# Patient Record
Sex: Female | Born: 1935 | Race: White | Hispanic: No | Smoking: Never smoker
Health system: Southern US, Community
[De-identification: ages and names within clinical notes are randomized; demographics above are authoritative.]

## PROBLEM LIST (undated history)

## (undated) DIAGNOSIS — Y92009 Unspecified place in unspecified non-institutional (private) residence as the place of occurrence of the external cause: Secondary | ICD-10-CM

## (undated) DIAGNOSIS — W19XXXA Unspecified fall, initial encounter: Secondary | ICD-10-CM

## (undated) DIAGNOSIS — E7800 Pure hypercholesterolemia, unspecified: Secondary | ICD-10-CM

## (undated) DIAGNOSIS — F32A Depression, unspecified: Secondary | ICD-10-CM

## (undated) DIAGNOSIS — I509 Heart failure, unspecified: Secondary | ICD-10-CM

## (undated) DIAGNOSIS — M1990 Unspecified osteoarthritis, unspecified site: Secondary | ICD-10-CM

## (undated) DIAGNOSIS — C4440 Unspecified malignant neoplasm of skin of scalp and neck: Secondary | ICD-10-CM

## (undated) DIAGNOSIS — C189 Malignant neoplasm of colon, unspecified: Secondary | ICD-10-CM

## (undated) DIAGNOSIS — I499 Cardiac arrhythmia, unspecified: Secondary | ICD-10-CM

## (undated) DIAGNOSIS — F329 Major depressive disorder, single episode, unspecified: Secondary | ICD-10-CM

## (undated) DIAGNOSIS — F419 Anxiety disorder, unspecified: Secondary | ICD-10-CM

## (undated) DIAGNOSIS — G039 Meningitis, unspecified: Secondary | ICD-10-CM

## (undated) DIAGNOSIS — R251 Tremor, unspecified: Secondary | ICD-10-CM

## (undated) DIAGNOSIS — F0390 Unspecified dementia without behavioral disturbance: Secondary | ICD-10-CM

## (undated) HISTORY — PX: CATARACT EXTRACTION W/ INTRAOCULAR LENS  IMPLANT, BILATERAL: SHX1307

## (undated) HISTORY — PX: HIP FRACTURE SURGERY: SHX118

## (undated) HISTORY — PX: FRACTURE SURGERY: SHX138

---
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
## 1981-05-06 DIAGNOSIS — C4440 Unspecified malignant neoplasm of skin of scalp and neck: Secondary | ICD-10-CM

## 1981-05-06 HISTORY — PX: SKIN CANCER EXCISION: SHX779

## 1981-05-06 HISTORY — DX: Unspecified malignant neoplasm of skin of scalp and neck: C44.40

## 2014-05-06 HISTORY — PX: COLON SURGERY: SHX602

## 2015-12-05 ENCOUNTER — Emergency Department (HOSPITAL_COMMUNITY): Payer: Medicare Other

## 2015-12-05 ENCOUNTER — Emergency Department (HOSPITAL_COMMUNITY)
Admission: EM | Admit: 2015-12-05 | Discharge: 2015-12-05 | Disposition: A | Discharge status: Home / Self Care | Payer: Medicare Other | Attending: Physician Assistant | Admitting: Physician Assistant

## 2015-12-05 ENCOUNTER — Encounter (HOSPITAL_COMMUNITY): Payer: Self-pay | Admitting: Emergency Medicine

## 2015-12-05 DIAGNOSIS — Y9389 Activity, other specified: Secondary | ICD-10-CM | POA: Insufficient documentation

## 2015-12-05 DIAGNOSIS — S82154A Nondisplaced fracture of right tibial tuberosity, initial encounter for closed fracture: Secondary | ICD-10-CM

## 2015-12-05 DIAGNOSIS — Z7982 Long term (current) use of aspirin: Secondary | ICD-10-CM | POA: Diagnosis not present

## 2015-12-05 DIAGNOSIS — Z96641 Presence of right artificial hip joint: Secondary | ICD-10-CM | POA: Diagnosis not present

## 2015-12-05 DIAGNOSIS — S82251A Displaced comminuted fracture of shaft of right tibia, initial encounter for closed fracture: Secondary | ICD-10-CM | POA: Insufficient documentation

## 2015-12-05 DIAGNOSIS — Z85818 Personal history of malignant neoplasm of other sites of lip, oral cavity, and pharynx: Secondary | ICD-10-CM | POA: Diagnosis not present

## 2015-12-05 DIAGNOSIS — S8991XA Unspecified injury of right lower leg, initial encounter: Secondary | ICD-10-CM | POA: Diagnosis present

## 2015-12-05 DIAGNOSIS — W010XXA Fall on same level from slipping, tripping and stumbling without subsequent striking against object, initial encounter: Secondary | ICD-10-CM | POA: Insufficient documentation

## 2015-12-05 DIAGNOSIS — Y92009 Unspecified place in unspecified non-institutional (private) residence as the place of occurrence of the external cause: Secondary | ICD-10-CM | POA: Diagnosis not present

## 2015-12-05 DIAGNOSIS — Z85038 Personal history of other malignant neoplasm of large intestine: Secondary | ICD-10-CM | POA: Diagnosis not present

## 2015-12-05 DIAGNOSIS — S82143A Displaced bicondylar fracture of unspecified tibia, initial encounter for closed fracture: Secondary | ICD-10-CM

## 2015-12-05 DIAGNOSIS — Y999 Unspecified external cause status: Secondary | ICD-10-CM | POA: Insufficient documentation

## 2015-12-05 DIAGNOSIS — I509 Heart failure, unspecified: Secondary | ICD-10-CM | POA: Insufficient documentation

## 2015-12-05 DIAGNOSIS — S82141A Displaced bicondylar fracture of right tibia, initial encounter for closed fracture: Secondary | ICD-10-CM

## 2015-12-05 HISTORY — DX: Depression, unspecified: F32.A

## 2015-12-05 HISTORY — DX: Heart failure, unspecified: I50.9

## 2015-12-05 HISTORY — DX: Major depressive disorder, single episode, unspecified: F32.9

## 2015-12-05 HISTORY — DX: Tremor, unspecified: R25.1

## 2015-12-05 HISTORY — DX: Unspecified osteoarthritis, unspecified site: M19.90

## 2015-12-05 HISTORY — DX: Anxiety disorder, unspecified: F41.9

## 2015-12-05 LAB — CBC WITH DIFFERENTIAL/PLATELET
Basophils Absolute: 0 10*3/uL (ref 0.0–0.1)
Basophils Relative: 0 %
EOS ABS: 0 10*3/uL (ref 0.0–0.7)
EOS PCT: 1 %
HCT: 40.8 % (ref 36.0–46.0)
HEMOGLOBIN: 13 g/dL (ref 12.0–15.0)
LYMPHS ABS: 0.9 10*3/uL (ref 0.7–4.0)
LYMPHS PCT: 11 %
MCH: 32.1 pg (ref 26.0–34.0)
MCHC: 31.9 g/dL (ref 30.0–36.0)
MCV: 100.7 fL — AB (ref 78.0–100.0)
MONOS PCT: 8 %
Monocytes Absolute: 0.6 10*3/uL (ref 0.1–1.0)
NEUTROS PCT: 80 %
Neutro Abs: 6.9 10*3/uL (ref 1.7–7.7)
Platelets: 177 10*3/uL (ref 150–400)
RBC: 4.05 MIL/uL (ref 3.87–5.11)
RDW: 12.8 % (ref 11.5–15.5)
WBC: 8.5 10*3/uL (ref 4.0–10.5)

## 2015-12-05 LAB — PROTIME-INR
INR: 1.11
PROTHROMBIN TIME: 14.3 s (ref 11.4–15.2)

## 2015-12-05 LAB — COMPREHENSIVE METABOLIC PANEL
ALT: 44 U/L (ref 14–54)
AST: 44 U/L — AB (ref 15–41)
Albumin: 3.3 g/dL — ABNORMAL LOW (ref 3.5–5.0)
Alkaline Phosphatase: 85 U/L (ref 38–126)
Anion gap: 6 (ref 5–15)
BUN: 12 mg/dL (ref 6–20)
CHLORIDE: 99 mmol/L — AB (ref 101–111)
CO2: 31 mmol/L (ref 22–32)
CREATININE: 0.85 mg/dL (ref 0.44–1.00)
Calcium: 9 mg/dL (ref 8.9–10.3)
GFR calc Af Amer: >60 mL/min (ref >60)
GFR calc non Af Amer: 58 mL/min — ABNORMAL LOW (ref >60)
Glucose, Bld: 104 mg/dL — ABNORMAL HIGH (ref 65–99)
Potassium: 5.1 mmol/L (ref 3.5–5.1)
SODIUM: 136 mmol/L (ref 135–145)
Total Bilirubin: 0.2 mg/dL — ABNORMAL LOW (ref 0.3–1.2)
Total Protein: 6.5 g/dL (ref 6.5–8.1)

## 2015-12-05 LAB — TYPE AND SCREEN
ABO/RH(D): O POS
Antibody Screen: NEGATIVE

## 2015-12-05 LAB — ABO/RH: ABO/RH(D): O POS

## 2015-12-05 MED ORDER — ONDANSETRON HCL 4 MG PO TABS: 4.0000 mg | ORAL_TABLET | Freq: Three times a day (TID) | ORAL | 0 refills | Status: DC | PRN

## 2015-12-05 MED ORDER — FENTANYL CITRATE (PF) 100 MCG/2ML IJ SOLN
25.0000 ug | Freq: Once | INTRAMUSCULAR | Status: AC
  Administered 2015-12-05: 25 ug via INTRAVENOUS
  Filled 2015-12-05: qty 2

## 2015-12-05 MED ORDER — OXYCODONE-ACETAMINOPHEN 5-325 MG PO TABS: 1.0000 | ORAL_TABLET | Freq: Four times a day (QID) | ORAL | 0 refills | Status: DC | PRN

## 2015-12-05 MED ORDER — ONDANSETRON HCL 4 MG/2ML IJ SOLN
4.0000 mg | Freq: Once | INTRAMUSCULAR | Status: AC
  Administered 2015-12-05: 4 mg via INTRAVENOUS
  Filled 2015-12-05: qty 2

## 2015-12-05 NOTE — ED Notes (Signed)
Mariann Laster from Case Management at bedside.

## 2015-12-05 NOTE — ED Notes (Signed)
Unable to obtain pulses on the right with doppler. Attempted with color option on the [place] with no success

## 2015-12-05 NOTE — ED Notes (Signed)
Xray at the bedside.

## 2015-12-05 NOTE — ED Notes (Signed)
Pt's daughter out in hallway to speak with the pt. Pt's daughter very unhappy about the amount of time it's taking for case manager and stated "take my mother off the monitor and I want her out of here now, this is completely unacceptable that it is taking so long." This RN in room with pt to explain process. Wanda CM now in room with pt now.

## 2015-12-05 NOTE — ED Triage Notes (Signed)
Pt came to [place] via [facility]. Pt stated that she fell this morning at 0600 at home because she tripped over a rug. She lives with family. Pt called out and daughter assisted pt in getting up. Pt complains of Right leg, knee, and hip pain. 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 Pt has a hx of CHF.  Vitals via EMS 140/84 76 p 16 rr 98%

## 2015-12-05 NOTE — Discharge Instructions (Signed)
Please keep your leg elevated above your head, with ice on it. Use knee immbolilizer until you follow up with Dr. Sharol Given.   You can use teylnol for pain and use the stronger pain medication we provided for breakthrough pain (you can use the nausea med with it) (But becareful because it can make people dizzy)

## 2015-12-05 NOTE — ED Provider Notes (Signed)
[facility] DEPT Provider Note   CSN: [id number] Arrival date & time: 12/05/15  [id number]  First Provider Contact:  First MD Initiated Contact with Patient 12/05/15 1022        History   Chief Complaint Chief Complaint  Patient presents with  . Fall    HPI Kayla Pace is a 82 y.o. female.  HPI   Pt is a 82 yo female presenting after mechanical fall at home. Recently moved here, no orthopeadist in the area. Patient has had total hip on the R with replacement of ball as well. Tripped over carpet today, family presetn. No LOC, did not strike head.   Pain to RLE.  Past Medical History:  Diagnosis Date  . Anxiety and depression   . Arthritis   . Cancer ([place])    Neck, colon  . CHF (congestive heart failure) ([place])   . Occasional tremors     There are no active problems to display for this patient.   Past Surgical History:  Procedure Laterality Date  . HIP FRACTURE SURGERY     Right side    OB History    No data available       Home Medications    Prior to Admission medications   Medication Sig Start Date End Date Taking? Authorizing Provider  Ascorbic Acid (VITAMIN C) 1000 MG tablet Take 1 tablet by mouth daily.   Yes Historical Provider, MD  aspirin 81 MG tablet Take 1 tablet by mouth daily. 02/09/08  Yes Historical Provider, MD  clonazePAM (KLONOPIN) 0.5 MG tablet Take 1 tablet by mouth at bedtime.   Yes Historical Provider, MD  digoxin (LANOXIN) 0.125 MG tablet Take 1 tablet by mouth daily. 03/25/12  Yes Historical Provider, MD  FLUoxetine (PROZAC) 20 MG capsule Take 1 capsule by mouth daily. 02/08/13  Yes Historical Provider, MD  fluticasone (FLONASE) 50 MCG/ACT nasal spray Place 2 sprays into the nose daily. 03/25/12  Yes Historical Provider, MD  furosemide (LASIX) 20 MG tablet Take 1 tablet by mouth daily. 02/08/13  Yes Historical Provider, MD  meloxicam (MOBIC) 7.5 MG tablet Take 1 tablet by mouth daily as needed for pain. 11/29/15 12/29/15 Yes Historical  Provider, MD  metoprolol succinate (TOPROL-XL) 25 MG 24 hr tablet Take 1 tablet by mouth daily. 02/01/13  Yes Historical Provider, MD  omeprazole (PRILOSEC) 20 MG capsule Take 1 capsule by mouth daily. 03/25/12  Yes Historical Provider, MD  primidone (MYSOLINE) 250 MG tablet Take 1 tablet by mouth 2 (two) times daily. 02/08/13  Yes Historical Provider, MD  simvastatin (ZOCOR) 10 MG tablet Take 1 tablet by mouth at bedtime. 03/25/12  Yes Historical Provider, MD    Family History No family history on file.  Social History Social History  Substance Use Topics  . Smoking status: Never Smoker  . Smokeless tobacco: Never Used  . Alcohol use No     Allergies   Codeine   Review of Systems Review of Systems  Constitutional: Negative for activity change.  Respiratory: Negative for shortness of breath.   Cardiovascular: Negative for chest pain.  Gastrointestinal: Negative for abdominal pain.  Musculoskeletal: Positive for gait problem and joint swelling.  Psychiatric/Behavioral: Negative for behavioral problems.  All other systems reviewed and are negative.    Physical Exam Updated Vital Signs BP 155/82 (BP Location: Right Arm)   Pulse 64   Temp 98.4 F (36.9 C) (Oral)   Resp 12   Ht 5\' 1"  (1.549 m)   Wt 98 lb (44.5  kg)   SpO2 100%   BMI 18.52 kg/m   Physical Exam  Constitutional: She appears well-developed and well-nourished. No distress.  HENT:  Head: Normocephalic and atraumatic.  Eyes: Conjunctivae are normal.  Neck: Neck supple.  Cardiovascular: Normal rate and regular rhythm.   No murmur heard. Pulmonary/Chest: Effort normal and breath sounds normal. No respiratory distress.  Abdominal: Soft. There is no tenderness.  Musculoskeletal: She exhibits no edema.  Deformed RLE,  Tight compartment. + cap refill distablly, moving distally, warm but no palpable pulse. + femoral pulse.  Neurological: She is alert.  Skin: Skin is warm and dry.  Psychiatric: She has a normal  mood and affect.  Nursing note and vitals reviewed.    ED Treatments / Results  Labs (all labs ordered are listed, but only abnormal results are displayed) Labs Reviewed  COMPREHENSIVE METABOLIC PANEL  CBC WITH DIFFERENTIAL/PLATELET  PROTIME-INR  TYPE AND SCREEN    EKG  EKG Interpretation None       Radiology No results found.  Procedures Procedures (including critical care time)  Medications Ordered in ED Medications - No data to display   Initial Impression / Assessment and Plan / ED Course  I have reviewed the triage vital signs and the nursing notes.  Pertinent labs & imaging results that were available during my care of the patient were reviewed by me and considered in my medical decision making (see chart for details).  Clinical Course  Comment By Time  10:22 AM Just saw patietn, no palpable pulse in lower extremity, nursing to find doppler. Xrays changed to portable. Pt comfortable.  Ailsa Mireles Lyn Thomasene Lot, MD 08/01 1022  10:51 AM Used [place] to find -arterial flow. Vashon Riordan Julio Alm, MD 08/01 1051  12:34 PM Discussed with Sharol Given, he will review films. Samaiya Awadallah Julio Alm, MD 08/01 1234  Dr. Sharol Given saw patient. Requesting medicine admit. Will discuss with medicine.  Alwin Lanigan Lyn Tola Meas, MD 08/01 1355  There is not much that warrants a hospitlal admission: no need for IV pain medicine, or abx. Knee just needs to be in an immbolizier, Medicine thinks outpatient managemetn appropriate. We will talk with case management to increase home services.  Deunte Bledsoe Lyn Rosine Solecki, MD 08/01 1429    Attempting to find doppler to confrim pulses. Patient not in great ammounts of pain so compartment syndrome less likely, though concerned given tightness and location of likely fracture. Portable xrays ordered.    2:43 PM Sharol Given saw patient, knee immobilizer and ice are the reccs. Awaiting case managemetn.   Case management will help maximize home health care. Pt's daughter has  special bed and chair at home to help with mobilization. She expressed understanding about icing and return precautions.   Final Clinical Impressions(s) / ED Diagnoses   Final diagnoses:  None    New Prescriptions New Prescriptions   No medications on file     Arch Methot Julio Alm, MD 12/06/15 [id number]

## 2015-12-05 NOTE — Consult Note (Signed)
ORTHOPAEDIC CONSULTATION  REQUESTING PHYSICIAN: Courteney Lyn Mackuen, MD  Chief Complaint: Right knee pain and swelling  HPI: Kayla Pace is a 82 y.o. female who presents with a minimally displaced tibial tuberosity fracture right knee. Patient had a ground-level fall at her daughter's home no head trauma and no loss of consciousness. Patient's daughter is just recently recovering from a stroke and a seizure. Patient 2 months ago had a hip replacement  secondary to a femoral neck fracture and was brought down from [place] to be cared for at her daughter's home.  Past Medical History:  Diagnosis Date  . Anxiety and depression   . Arthritis   . Cancer ([place])    Neck, colon  . CHF (congestive heart failure) ([place])   . Occasional tremors    Past Surgical History:  Procedure Laterality Date  . HIP FRACTURE SURGERY     Right side   Social History   Social History  . Marital status: Unknown    Spouse name: N/A  . Number of children: N/A  . Years of education: N/A   Social History Main Topics  . Smoking status: Never Smoker  . Smokeless tobacco: Never Used  . Alcohol use No  . Drug use: No  . Sexual activity: Not Asked   Other Topics Concern  . None   Social History Narrative  . None   No family history on file. - negative except otherwise stated in the family history section Allergies  Allergen Reactions  . Codeine Nausea And Vomiting  . Morphine Other (See Comments)   Prior to Admission medications   Medication Sig Start Date End Date Taking? Authorizing Provider  Ascorbic Acid (VITAMIN C) 1000 MG tablet Take 1 tablet by mouth daily.   Yes Historical Provider, MD  aspirin 81 MG tablet Take 1 tablet by mouth daily. 02/09/08  Yes Historical Provider, MD  clonazePAM (KLONOPIN) 0.5 MG tablet Take 1 tablet by mouth at bedtime.   Yes Historical Provider, MD  digoxin (LANOXIN) 0.125 MG tablet Take 1 tablet by mouth daily. 03/25/12  Yes Historical Provider, MD    FLUoxetine (PROZAC) 20 MG capsule Take 1 capsule by mouth daily. 02/08/13  Yes Historical Provider, MD  fluticasone (FLONASE) 50 MCG/ACT nasal spray Place 2 sprays into the nose daily. 03/25/12  Yes Historical Provider, MD  furosemide (LASIX) 20 MG tablet Take 1 tablet by mouth daily. 02/08/13  Yes Historical Provider, MD  meloxicam (MOBIC) 7.5 MG tablet Take 1 tablet by mouth daily as needed for pain. 11/29/15 12/29/15 Yes Historical Provider, MD  metoprolol succinate (TOPROL-XL) 25 MG 24 hr tablet Take 1 tablet by mouth daily. 02/01/13  Yes Historical Provider, MD  omeprazole (PRILOSEC) 20 MG capsule Take 1 capsule by mouth daily. 03/25/12  Yes Historical Provider, MD  primidone (MYSOLINE) 250 MG tablet Take 1 tablet by mouth 2 (two) times daily. 02/08/13  Yes Historical Provider, MD  simvastatin (ZOCOR) 10 MG tablet Take 1 tablet by mouth at bedtime. 03/25/12  Yes Historical Provider, MD   Dg Tibia/fibula Right  Result Date: 12/05/2015 CLINICAL DATA:  Acute right lower leg pain after fall today. EXAM: RIGHT TIBIA AND FIBULA - 2 VIEW COMPARISON:  None. FINDINGS: Moderately displaced and probably comminuted fracture is seen involving the tibial tuberosity in the proximal tibia. This appears to be closed and posttraumatic. Severe degenerative changes seen involving the right knee. No soft tissue abnormality is noted. IMPRESSION: Moderately displaced and probably comminuted fracture involving tibial tuberosity  of the proximal right tibia. CT scan may be performed for further evaluation. Electronically Signed   By: Marijo Conception, M.D.   On: 12/05/2015 11:41   Dg Ankle 2 Views Right  Result Date: 12/05/2015 CLINICAL DATA:  Fall this morning. Slipped on a or rub. Right ankle pain. Initial encounter. EXAM: RIGHT ANKLE - 2 VIEW COMPARISON:  None. FINDINGS: The bones are osteopenic. There is mild soft tissue swelling about the ankle, greatest laterally. No acute fracture or dislocation is identified. A small  posterior calcaneal enthesophyte is noted. Vascular calcifications are noted. IMPRESSION: Soft tissue swelling without acute osseous abnormality identified. Electronically Signed   By: Logan Bores M.D.   On: 12/05/2015 11:44   Dg Knee Right Port  Result Date: 12/05/2015 CLINICAL DATA:  Right knee pain after fall today. EXAM: PORTABLE RIGHT KNEE - 1-2 VIEW COMPARISON:  None. FINDINGS: Severe narrowing of lateral joint space is noted. Moderately displaced and probably comminuted fracture is seen involving the tibial tuberosity. Mild suprapatellar joint effusion is noted. IMPRESSION: Moderately displaced and probably comminuted fracture of the tibial tuberosity. CT scan may be performed for further evaluation. Severe degenerative joint disease is noted laterally. Electronically Signed   By: Marijo Conception, M.D.   On: 12/05/2015 13:07   Dg Hip Unilat W Or W/o Pelvis 2-3 Views Right  Result Date: 12/05/2015 CLINICAL DATA:  [place] this morning.  Right-sided hip pain. EXAM: DG HIP (WITH OR WITHOUT PELVIS) 2-3V RIGHT COMPARISON:  None. FINDINGS: Previous bipolar hip replacement on the right. No evidence of fracture or dislocation. IMPRESSION: No acute finding.  Previous bipolar hip replacement on the right. Electronically Signed   By: Nelson Chimes M.D.   On: 12/05/2015 11:41   - pertinent xrays, CT, MRI studies were reviewed and independently interpreted  Positive ROS: All other systems have been reviewed and were otherwise negative with the exception of those mentioned in the HPI and as above.  Physical Exam: General: Alert, no acute distress Cardiovascular: No pedal edema Respiratory: No cyanosis, no use of accessory musculature GI: No organomegaly, abdomen is soft and non-tender Skin: Swelling and ecchymosis and bruising over the proximal tibia. Patient's compartments are soft she has no pain with active or passive range of motion of the ankle or toes. There is no open wound noted drainage. Neurologic:  Sensation intact distally Psychiatric: Patient is competent for consent with normal mood and affect Lymphatic: No axillary or cervical lymphadenopathy  MUSCULOSKELETAL:  On examination patient has a palpable dorsalis pedis pulse. Her foot is warm with good capillary refill. The swelling is directly over the tibial tuberosity. There are no skin lesions no breakdown of the skin. Review of the radiographs shows a minimally displaced tibial tuberosity fracture. The joint was congruent no signs of a tibial plateau fracture. The tibial shaft shows no fractures and the ankle shows no fractures.  Assessment: Assessment: Proximal tibial tuberosity fracture with swelling and ecchymosis and bruising with no signs or symptoms of compartment syndrome.  Plan: Plan: Patient most likely would benefit from admission with observation and nursing placement. We'll apply a knee immobilizer she may be weightbearing as tolerated with knee immobilizer she will need strict elevation of her leg and ice to minimize risks of compartment syndrome.  Thank you for the consult and the opportunity to see Ms. Maryelizabeth Rowan, [place] [phone] 1:48 PM

## 2015-12-05 NOTE — ED Notes (Signed)
Pt called out and states she was nauseous. MD notified and to place order for zofran.

## 2015-12-05 NOTE — Care Management Note (Signed)
Case Management Note  Patient Details  Name: Kayla Pace MRN: [id number] Date of Birth: 1922/03/27  Subjective/Objective:     S/p Fractured patellar                Action/Plan: CM met with patient and daughter Hassan Rowan [phone] to discuss recommendations for [facility] services Verified information with patient and family. Pt lives at home at home with daughter Hassan Rowan as  primary care giver, [facility] services are agreed upon by patient and family as disposition plan. Discussed HH services RN/PT/OT/HHA . Offered choice, provided [facility] agency list, Patient and daughter selected [facility] services. Daughter denies the need of DME but requesting shower chair, prescription provided.  Explained the services to be received by [facility] services, patient and daughter verbalized understanding teach back done. Patient and  Verified contact information. Referral faxed in to [facility] [phone], fax confirmation received. No further questions or concerns verbalized. Provided patient and daughter contact information should any further question or concerns should arise. No further ED CM needs identified.  Expected Discharge Date:    12/05/2015              Expected Discharge Plan:  [place]  In-House Referral:     Discharge planning Services  CM Consult  Post Acute Care Choice:    Choice offered to:  Patient, Adult Children  DME Arranged:    DME Agency:     HH Arranged:  RN, PT, OT, Nurse's Aide [place] Agency:  [facility]  Status of Service:  Completed, signed off  If discussed at [place] of Stay Meetings, dates discussed:    Additional CommentsLaurena Slimmer, RN 12/05/2015, 6:30 PM

## 2015-12-07 ENCOUNTER — Encounter (HOSPITAL_COMMUNITY): Payer: Self-pay | Admitting: General Practice

## 2015-12-07 ENCOUNTER — Observation Stay (HOSPITAL_COMMUNITY)
Admission: AD | Admit: 2015-12-07 | Discharge: 2015-12-08 | Disposition: A | Discharge status: Home / Self Care | Payer: Medicare Other | Source: Ambulatory Visit | Attending: Orthopedic Surgery | Admitting: Orthopedic Surgery

## 2015-12-07 ENCOUNTER — Other Ambulatory Visit (HOSPITAL_COMMUNITY): Payer: Self-pay | Admitting: Family

## 2015-12-07 DIAGNOSIS — Z85038 Personal history of other malignant neoplasm of large intestine: Secondary | ICD-10-CM | POA: Insufficient documentation

## 2015-12-07 DIAGNOSIS — S82101A Unspecified fracture of upper end of right tibia, initial encounter for closed fracture: Principal | ICD-10-CM | POA: Insufficient documentation

## 2015-12-07 DIAGNOSIS — W1830XA Fall on same level, unspecified, initial encounter: Secondary | ICD-10-CM | POA: Insufficient documentation

## 2015-12-07 DIAGNOSIS — R262 Difficulty in walking, not elsewhere classified: Secondary | ICD-10-CM | POA: Diagnosis not present

## 2015-12-07 DIAGNOSIS — I509 Heart failure, unspecified: Secondary | ICD-10-CM | POA: Insufficient documentation

## 2015-12-07 DIAGNOSIS — Z7982 Long term (current) use of aspirin: Secondary | ICD-10-CM | POA: Insufficient documentation

## 2015-12-07 DIAGNOSIS — R627 Adult failure to thrive: Secondary | ICD-10-CM | POA: Diagnosis not present

## 2015-12-07 HISTORY — DX: Malignant neoplasm of colon, unspecified: C18.9

## 2015-12-07 HISTORY — DX: Depression, unspecified: F32.A

## 2015-12-07 HISTORY — DX: Meningitis, unspecified: G03.9

## 2015-12-07 HISTORY — DX: Unspecified fall, initial encounter: W19.XXXA

## 2015-12-07 HISTORY — DX: Anxiety disorder, unspecified: F41.9

## 2015-12-07 HISTORY — DX: Major depressive disorder, single episode, unspecified: F32.9

## 2015-12-07 HISTORY — DX: Unspecified place in unspecified non-institutional (private) residence as the place of occurrence of the external cause: Y92.009

## 2015-12-07 HISTORY — DX: Cardiac arrhythmia, unspecified: I49.9

## 2015-12-07 HISTORY — DX: Pure hypercholesterolemia, unspecified: E78.00

## 2015-12-07 HISTORY — DX: Unspecified malignant neoplasm of skin of scalp and neck: C44.40

## 2015-12-07 LAB — CREATININE, SERUM
Creatinine, Ser: 0.84 mg/dL (ref 0.44–1.00)
GFR calc Af Amer: >60 mL/min (ref >60)
GFR calc non Af Amer: 59 mL/min — ABNORMAL LOW (ref >60)

## 2015-12-07 LAB — CBC
HEMATOCRIT: 32.9 % — AB (ref 36.0–46.0)
Hemoglobin: 10.5 g/dL — ABNORMAL LOW (ref 12.0–15.0)
MCH: 31.8 pg (ref 26.0–34.0)
MCHC: 31.9 g/dL (ref 30.0–36.0)
MCV: 99.7 fL (ref 78.0–100.0)
Platelets: 161 10*3/uL (ref 150–400)
RBC: 3.3 MIL/uL — ABNORMAL LOW (ref 3.87–5.11)
RDW: 13.1 % (ref 11.5–15.5)
WBC: 7.4 10*3/uL (ref 4.0–10.5)

## 2015-12-07 MED ORDER — SODIUM CHLORIDE 0.9% FLUSH
10.0000 mL | INTRAVENOUS | Status: DC | PRN
  Administered 2015-12-07: 10 mL
  Filled 2015-12-07: qty 40

## 2015-12-07 MED ORDER — HEPARIN SODIUM (PORCINE) 5000 UNIT/ML IJ SOLN
5000.0000 [IU] | Freq: Three times a day (TID) | INTRAMUSCULAR | Status: DC
  Administered 2015-12-07 – 2015-12-08 (×2): 5000 [IU] via SUBCUTANEOUS
  Filled 2015-12-07 (×3): qty 1

## 2015-12-07 MED ORDER — ACETAMINOPHEN 650 MG RE SUPP: 650.0000 mg | Freq: Four times a day (QID) | RECTAL | Status: DC | PRN

## 2015-12-07 MED ORDER — HYDROCODONE-ACETAMINOPHEN 5-325 MG PO TABS
1.0000 | ORAL_TABLET | ORAL | Status: DC | PRN
  Administered 2015-12-07: 1 via ORAL
  Filled 2015-12-07 (×2): qty 1

## 2015-12-07 MED ORDER — ACETAMINOPHEN 325 MG PO TABS: 650.0000 mg | ORAL_TABLET | Freq: Four times a day (QID) | ORAL | Status: DC | PRN

## 2015-12-07 NOTE — Progress Notes (Signed)
Spoke with patient about her living situation.  She told me she does feel safe living with her daughter.

## 2015-12-07 NOTE — Progress Notes (Signed)
   12/07/15 1700  Clinical Encounter Type  Visited With Patient  Visit Type Initial  Referral From Nurse  Consult/Referral To Chaplain  Advance Directives (For Healthcare)  Does patient want to make changes to advanced directive? Yes - Spiritual care consult ordered  CHP met with patient and helped fill out AD.  CHP needs to follow up tomorrow morning. Note: Patient doesn't want family member or others in room when notarizing.  Roe Coombs 12/07/15

## 2015-12-08 DIAGNOSIS — S82101A Unspecified fracture of upper end of right tibia, initial encounter for closed fracture: Secondary | ICD-10-CM | POA: Diagnosis not present

## 2015-12-08 NOTE — Evaluation (Signed)
Physical Therapy Evaluation Patient Details Name: Amaziah Sona MRN: [id number] DOB: 1922/03/25 Today's Date: 12/08/2015   History of Present Illness  Pt is a pleasant 82 y/o female s/p R proximal tibia fx secondary to mechanical fall. PMH including but not limited to CHF and colon cancer.  Clinical Impression  Pt presented supine in bed with HOB elevated, awake and willing to participate in therapy session. Prior to admission, pt stated she used a RW to ambulate within her home, and her daughter performed ADLs for her. Pt reported no pain at rest, but with movement of R LE pt grimacing and moaning. Pt was able to transfer from bed to recliner chair with max A of two. Pt would continue to benefit from skilled physical therapy services at this time while admitted and after d/c to address her below listed limitations in order to improve her safety and independence with functional mobility.      Follow Up Recommendations SNF    Equipment Recommendations  None recommended by PT;Other (comment) (deferred to next venue)    Recommendations for Other Services       Precautions / Restrictions Precautions Precautions: Fall Restrictions Weight Bearing Restrictions: Yes RLE Weight Bearing: Weight bearing as tolerated      Mobility  Bed Mobility Overal bed mobility: Needs Assistance;+2 for physical assistance Bed Mobility: Supine to Sit     Supine to sit: Max assist;+2 for physical assistance;HOB elevated     General bed mobility comments: pt required increased time; pad utilized to assist with transition  Transfers Overall transfer level: Needs assistance Equipment used: Rolling walker (2 wheeled) Transfers: Risk manager;Sit to/from Stand Sit to Stand: Mod assist;+2 physical assistance Stand pivot transfers: +2 physical assistance;Total assist       General transfer comment: pt required increased time and VC'ing for bilateral hand positioning  Ambulation/Gait                 Stairs            Wheelchair Mobility    Modified Rankin (Stroke Patients Only)       Balance Overall balance assessment: Needs assistance Sitting-balance support: Feet supported;Bilateral upper extremity supported Sitting balance-Leahy Scale: Poor       Standing balance-Leahy Scale: Zero                               Pertinent Vitals/Pain Pain Assessment: No/denies pain    Home Living Family/patient expects to be discharged to:: Skilled nursing facility Living Arrangements: Children                    Prior Function Level of Independence: Needs assistance   Gait / Transfers Assistance Needed: uses RW for ambulation within home  ADL's / Homemaking Assistance Needed: needs assist from daughter for dressing and bathing        Hand Dominance        Extremity/Trunk Assessment   Upper Extremity Assessment: Generalized weakness           Lower Extremity Assessment: RLE deficits/detail RLE Deficits / Details: Pt with decreased strength and ROM limitations secondary to proximal tibia fx. Sensation is grossly intact.    Cervical / Trunk Assessment: Kyphotic  Communication   Communication: No difficulties  Cognition Arousal/Alertness: Awake/alert Behavior During Therapy: WFL for tasks assessed/performed Overall Cognitive Status: Within Functional Limits for tasks assessed  General Comments      Exercises        Assessment/Plan    PT Assessment Patient needs continued PT services  PT Diagnosis Difficulty walking;Acute pain   PT Problem List Decreased strength;Decreased range of motion;Decreased activity tolerance;Decreased balance;Decreased mobility;Decreased coordination;Decreased knowledge of use of DME;Pain  PT Treatment Interventions DME instruction;Gait training;Functional mobility training;Stair training;Therapeutic activities;Therapeutic exercise;Balance training;Patient/family  education   PT Goals (Current goals can be found in the Care Plan section) Acute Rehab PT Goals Patient Stated Goal: go to rehab for more assistance PT Goal Formulation: With patient Time For Goal Achievement: 12/15/15 Potential to Achieve Goals: Fair    Frequency Min 5X/week   Barriers to discharge        Co-evaluation               End of Session Equipment Utilized During Treatment: Gait belt Activity Tolerance: Patient limited by fatigue;Patient limited by pain Patient left: in chair;with call bell/phone within reach;with chair alarm set;Other (comment) (bilateral LE's elevated) Nurse Communication: Mobility status         Time: [id number] PT Time Calculation (min) (ACUTE ONLY): 12 min   Charges:   PT Evaluation $PT Eval Moderate Complexity: 1 Procedure     PT G CodesClearnce Sorrel Ravon [place] 12/08/2015, 9:39 AM Sherie Don, PT, DPT [phone]

## 2015-12-08 NOTE — Care Management (Signed)
Care manager spoke with patient's daughter concerning discharge plan. Daughter very upset that her mom cant go to SNF from here and that mom is upset concerning the  Medicare Observation Letter. CM attempted to explain the observation status but daughter not interested at this point and states she will be calling Dr. Sharol Given . Case manager is going to try to  arrange for home health. [facility] has declined the patient.

## 2015-12-08 NOTE — Care Management (Signed)
Patient is in observation status under medicare guidelines. Patient does not qualify for discharge to SNF. Per Dr. Reynaldo Minium, medical advisor, patient must be discharged. CM contacted Dr. Sharol Given and advised of same. CM will contact [facility].

## 2015-12-08 NOTE — Progress Notes (Signed)
   12/08/15 0945  Clinical Encounter Type  Visited With Patient  Visit Type Initial;Follow-up  Referral From Chaplain  Spiritual Encounters  Spiritual Needs Emotional  Stress Factors  Patient Stress Factors Financial concerns;Lack of knowledge  Chaplain visited patient to follow up a completion of an Advanced Directive.  When Chaplain arrived patient was very upset.  Patient under the impression her medicare had been cancelled.  Chaplain unable to move forward with AD because patient is visibly distressed.  Chaplain conferred with Social Worker to address concerns of the patient.  Chaplain explained to patient she would get someone to assist her with her concerns.  Chaplain offered to return to complete AD when the patient was ready.

## 2015-12-08 NOTE — H&P (Signed)
Kayla Pace is an 82 y.o. female.   Chief Complaint: Right leg pain and inability to ambulate failure to thrive. HPI: Patient is a 82 year old woman who had a ground-level fall at home without head trauma. Patient sustained a nondisplaced tibial tuberosity fracture. Patient was evaluated placed in a knee immobilizer. At that time I recommended evaluation for admission for ultimate placement in skilled nursing. Patient was discharged to home from the emergency room patient was then unable to independently ambulate her daughter who is status post a stroke was unable to care for the patient and patient was brought to my office yesterday. At this time evaluation patient did have worsening of the fracture blisters on the proximal right leg and with her failure to thrive patient was admitted for evaluation for medical conditions orthopedic conditions and evaluation for admission to skilled nursing.  Past Medical History:  Diagnosis Date  . Anxiety   . Anxiety and depression   . Arthritis   . Cancer of skin of neck 1983   "right side"  . CHF (congestive heart failure) ([place])   . Colon cancer ([place]) ~ 2015  . Depression   . Fall at home ?12/05/2015   minimally displaced tibial tuberosity fracture right knee  . High cholesterol   . Irregular heart beats   . Meningitis 1926   caused right foot deformity   . Occasional tremors     Past Surgical History:  Procedure Laterality Date  . CATARACT EXTRACTION W/ INTRAOCULAR LENS  IMPLANT, BILATERAL Bilateral   . COLON SURGERY  2016   "had part of it taken out; in [place]"  . FRACTURE SURGERY    . HIP FRACTURE SURGERY Right   . SKIN CANCER EXCISION  1983   "took radiation shots too"    History reviewed. No pertinent family history. Social History:  reports that she has never smoked. She has never used smokeless tobacco. She reports that she does not drink alcohol or use drugs.  Allergies:  Allergies  Allergen Reactions  . Codeine Nausea And  Vomiting  . Morphine Other (See Comments)    Medications Prior to Admission  Medication Sig Dispense Refill  . Ascorbic Acid (VITAMIN C) 1000 MG tablet Take 1 tablet by mouth daily.    Marland Kitchen aspirin 81 MG tablet Take 1 tablet by mouth daily.    . clonazePAM (KLONOPIN) 0.5 MG tablet Take 1 tablet by mouth at bedtime.    . digoxin (LANOXIN) 0.125 MG tablet Take 1 tablet by mouth daily.    Marland Kitchen FLUoxetine (PROZAC) 20 MG capsule Take 1 capsule by mouth daily.    . fluticasone (FLONASE) 50 MCG/ACT nasal spray Place 2 sprays into the nose daily as needed for allergies.     . furosemide (LASIX) 20 MG tablet Take 1 tablet by mouth daily.    . meloxicam (MOBIC) 7.5 MG tablet Take 1 tablet by mouth daily as needed for pain.    . metoprolol succinate (TOPROL-XL) 25 MG 24 hr tablet Take 1 tablet by mouth daily.    Marland Kitchen omeprazole (PRILOSEC) 20 MG capsule Take 1 capsule by mouth daily.    . ondansetron (ZOFRAN) 4 MG tablet Take 1 tablet (4 mg total) by mouth every 8 (eight) hours as needed for nausea or vomiting. 11 tablet 0  . oxyCODONE-acetaminophen (PERCOCET/ROXICET) 5-325 MG tablet Take 1 tablet by mouth every 6 (six) hours as needed for severe pain. 11 tablet 0  . primidone (MYSOLINE) 250 MG tablet Take 1 tablet by  mouth 2 (two) times daily.    . simvastatin (ZOCOR) 10 MG tablet Take 1 tablet by mouth at bedtime.      Results for orders placed or performed during the hospital encounter of 12/07/15 (from the past 48 hour(s))  CBC     Status: Abnormal   Collection Time: 12/07/15 11:13 AM  Result Value Ref Range   WBC 7.4 4.0 - 10.5 K/uL   RBC 3.30 (L) 3.87 - 5.11 MIL/uL   Hemoglobin 10.5 (L) 12.0 - 15.0 g/dL   HCT 32.9 (L) 36.0 - 46.0 %   MCV 99.7 78.0 - 100.0 fL   MCH 31.8 26.0 - 34.0 pg   MCHC 31.9 30.0 - 36.0 g/dL   RDW 13.1 11.5 - 15.5 %   Platelets 161 150 - 400 K/uL  Creatinine, serum     Status: Abnormal   Collection Time: 12/07/15 11:13 AM  Result Value Ref Range   Creatinine, Ser 0.84 0.44  - 1.00 mg/dL   GFR calc non Af Amer 59 (L) >60 mL/min   GFR calc Af Amer >60 >60 mL/min    Comment: (NOTE) The eGFR has been calculated using the CKD EPI equation. This calculation has not been validated in all clinical situations. eGFR's persistently <60 mL/min signify possible Chronic Kidney Disease.    No results found.  Review of Systems  All other systems reviewed and are negative.   Blood pressure (!) 116/52, pulse 68, temperature 99.1 F (37.3 C), temperature source Oral, resp. rate 16, weight 43 kg (94 lb 12.8 oz), SpO2 100 %. Physical Exam  On examination patient had multiple superficial fracture blisters these were sterilely prepped and decompressed. Patient was placed in a medical compression sock to help with wound healing. Patient was alert oriented no adenopathy well-dressed normal affect she was unable to independently ambulate. Assessment/Plan Assessment: Nondisplaced right proximal tibia fracture with fracture blisters with failure to thrive. Plan we will have patient evaluated with physical therapy will have some social workers evaluate for placement in skilled nursing. We'll provide wound care.   Newt Minion, MD 12/08/2015, 6:31 AM

## 2015-12-08 NOTE — Care Management Obs Status (Signed)
[facility] NOTIFICATION   Patient Details  Name: Kayla Pace MRN: [id number] Date of Birth: [birth year]   Medicare Observation Status Notification Given:  Yes    Ninfa Meeker, RN 12/08/2015, 11:39 AM

## 2015-12-08 NOTE — Progress Notes (Signed)
Patient ID: Kayla Pace, female   DOB: 02/23/1922, 82 y.o.   MRN: [id number] Patient is seen this morning her knee immobilizer is in place the medical compression stocking is in place. Plan for social worker evaluation for discharge to skilled nursing. Plan for physical therapy progressive ambulation weightbearing as tolerated on the right with the immobilizer in place.

## 2015-12-08 NOTE — Care Management (Signed)
Case manager contacted Tonny Branch with [facility], Referral was given to him for HHPT/OT,SW.  CM asked that start of care be 12/09/15.

## 2015-12-08 NOTE — Discharge Summary (Signed)
Physician Discharge Summary  Patient ID: Kayla Pace MRN: [id number] DOB/AGE: 1921-07-23 82 y.o.  Admit date: 12/07/2015 Discharge date: 12/08/2015  Admission Diagnoses:Right proximal tibia fracture with fracture blisters with inability to ambulate independently at home  Discharge Diagnoses: Samestable Active Problems:   Failure to thrive in adult   Discharged Condition: stable  Hospital Course: Patient's host course essentially unremarkable she was admitted for physical therapy progressive ambulation and evaluation for discharge to skilled nursing she was discharged back to home as per recommendations of the medical director.  Consults: None  Significant Diagnostic Studies: labs: Routine labs  Treatments: Physical therapy  Discharge Exam: Blood pressure (!) 149/64, pulse 71, temperature 98.9 F (37.2 C), temperature source Oral, resp. rate 16, weight 43 kg (94 lb 12.8 oz), SpO2 99 %. Incision/Wound: Patient's fracture blisters are stable  Disposition: 01-Home or Self Care  Discharge Instructions    Call MD / Call 911    Complete by:  As directed   If you experience chest pain or shortness of breath, CALL 911 and be transported to the hospital emergency room.  If you develope a fever above 101 F, pus (white drainage) or increased drainage or redness at the wound, or calf pain, call your surgeon's office.   Constipation Prevention    Complete by:  As directed   Drink plenty of fluids.  Prune juice may be helpful.  You may use a stool softener, such as Colace (over the counter) 100 mg twice a day.  Use MiraLax (over the counter) for constipation as needed.   Diet - low sodium heart healthy    Complete by:  As directed   Increase activity slowly as tolerated    Complete by:  As directed       Medication List    TAKE these medications   aspirin 81 MG tablet Take 1 tablet by mouth daily.   clonazePAM 0.5 MG tablet Commonly known as:  KLONOPIN Take 1 tablet by mouth at  bedtime.   digoxin 0.125 MG tablet Commonly known as:  LANOXIN Take 1 tablet by mouth daily.   FLUoxetine 20 MG capsule Commonly known as:  PROZAC Take 1 capsule by mouth daily.   fluticasone 50 MCG/ACT nasal spray Commonly known as:  FLONASE Place 2 sprays into the nose daily as needed for allergies.   furosemide 20 MG tablet Commonly known as:  LASIX Take 1 tablet by mouth daily.   meloxicam 7.5 MG tablet Commonly known as:  MOBIC Take 1 tablet by mouth daily as needed for pain.   metoprolol succinate 25 MG 24 hr tablet Commonly known as:  TOPROL-XL Take 1 tablet by mouth daily.   omeprazole 20 MG capsule Commonly known as:  PRILOSEC Take 1 capsule by mouth daily.   ondansetron 4 MG tablet Commonly known as:  ZOFRAN Take 1 tablet (4 mg total) by mouth every 8 (eight) hours as needed for nausea or vomiting.   oxyCODONE-acetaminophen 5-325 MG tablet Commonly known as:  PERCOCET/ROXICET Take 1 tablet by mouth every 6 (six) hours as needed for severe pain.   primidone 250 MG tablet Commonly known as:  MYSOLINE Take 1 tablet by mouth 2 (two) times daily.   simvastatin 10 MG tablet Commonly known as:  ZOCOR Take 1 tablet by mouth at bedtime.   vitamin C 1000 MG tablet Take 1 tablet by mouth daily.        Signed: DUDA,MARCUS V 12/08/2015, 12:05 PM

## 2015-12-16 NOTE — Progress Notes (Signed)
PT eval addendum. Late entry for 12/08/15.   12/08/15 0900  PT Visit Information  Last PT Received On 12/08/15  Assistance Needed +2  History of Present Illness Pt is a pleasant 82 y/o female s/p R proximal tibia fx secondary to mechanical fall. PMH including but not limited to CHF and colon cancer.  Precautions  Precautions Fall  Restrictions  Weight Bearing Restrictions Yes  RLE Weight Bearing WBAT  Home Living  Family/patient expects to be discharged to: [place]  Prior Function  Level of Independence Needs assistance  Gait / Transfers Assistance Needed uses RW for ambulation within home  ADL's / Brown needs assist from daughter for dressing and bathing  Communication  Communication No difficulties  Pain Assessment  Pain Assessment No/denies pain  Cognition  Arousal/Alertness Awake/alert  Behavior During Therapy WFL for tasks assessed/performed  Overall Cognitive Status Within Functional Limits for tasks assessed  Upper Extremity Assessment  Upper Extremity Assessment Generalized weakness  Lower Extremity Assessment  Lower Extremity Assessment RLE deficits/detail  RLE Deficits / Details Pt with decreased strength and ROM limitations secondary to proximal tibia fx. Sensation is grossly intact.  Cervical / Trunk Assessment  Cervical / Trunk Assessment Kyphotic  Bed Mobility  Overal bed mobility Needs Assistance;+2 for physical assistance  Bed Mobility Supine to Sit  Supine to sit Max assist;+2 for physical assistance;HOB elevated  General bed mobility comments pt required increased time; pad utilized to assist with transition  Transfers  Overall transfer level Needs assistance  Equipment used Rolling walker (2 wheeled)  Transfers Stand Pivot Transfers;Sit to/from Stand  Sit to Stand Mod assist;+2 physical assistance  Stand pivot transfers +2 physical assistance;Total assist  General transfer comment pt  required increased time and VC'ing for bilateral hand positioning  Balance  Overall balance assessment Needs assistance  Sitting-balance support Feet supported;Bilateral upper extremity supported  Sitting balance-Leahy Scale Poor  Standing balance-Leahy Scale Zero  PT - End of Session  Equipment Utilized During Treatment Gait belt  Activity Tolerance Patient limited by fatigue;Patient limited by pain  Patient left in chair;with call bell/phone within reach;with chair alarm set;Other (comment) (bilateral LE's elevated)  Nurse Communication Mobility status  PT Assessment  PT Therapy Diagnosis  Difficulty walking;Acute pain  PT Recommendation/Assessment Patient needs continued PT services  PT Problem List Decreased strength;Decreased range of motion;Decreased activity tolerance;Decreased balance;Decreased mobility;Decreased coordination;Decreased knowledge of use of DME;Pain  PT Plan  PT Frequency (ACUTE ONLY) Min 5X/week  PT Treatment/Interventions (ACUTE ONLY) DME instruction;Gait training;Functional mobility training;Stair training;Therapeutic activities;Therapeutic exercise;Balance training;Patient/family education  PT Recommendation  Follow Up Recommendations SNF  PT equipment None recommended by PT;Other (comment) (deferred to next venue)  Individuals Consulted  Consulted and Agree with Results and Recommendations Patient  Acute Rehab PT Goals  Patient Stated Goal go to rehab for more assistance  PT Goal Formulation With patient  Time For Goal Achievement 12/15/15  Potential to Achieve Goals Fair  PT Time Calculation  PT Start Time (ACUTE ONLY) 0905  PT Stop Time (ACUTE ONLY) 0917  PT Time Calculation (min) (ACUTE ONLY) 12 min  PT G-Codes **NOT FOR INPATIENT CLASS**  Functional Assessment Tool Used clinical judgement  Functional Limitation Mobility: Walking and moving around  Mobility: Walking and Moving Around Current Status [id number]) CM  Mobility: Walking and Moving Around  Goal Status [id number]) CK  PT General Charges  $$ ACUTE PT VISIT 1 Procedure  PT Evaluation  $PT Eval Moderate Complexity 1 Procedure  [place], [place], [place] [phone]

## 2017-06-08 ENCOUNTER — Encounter: Payer: Self-pay | Admitting: Emergency Medicine

## 2017-06-08 ENCOUNTER — Emergency Department
Admission: EM | Admit: 2017-06-08 | Discharge: 2017-06-08 | Disposition: A | Discharge status: Home / Self Care | Payer: Medicare Other | Attending: Emergency Medicine | Admitting: Emergency Medicine

## 2017-06-08 ENCOUNTER — Emergency Department: Payer: Medicare Other

## 2017-06-08 ENCOUNTER — Other Ambulatory Visit: Payer: Self-pay

## 2017-06-08 DIAGNOSIS — Z85828 Personal history of other malignant neoplasm of skin: Secondary | ICD-10-CM | POA: Diagnosis not present

## 2017-06-08 DIAGNOSIS — Y9389 Activity, other specified: Secondary | ICD-10-CM | POA: Insufficient documentation

## 2017-06-08 DIAGNOSIS — F0390 Unspecified dementia without behavioral disturbance: Secondary | ICD-10-CM | POA: Diagnosis not present

## 2017-06-08 DIAGNOSIS — M179 Osteoarthritis of knee, unspecified: Secondary | ICD-10-CM | POA: Diagnosis not present

## 2017-06-08 DIAGNOSIS — W010XXA Fall on same level from slipping, tripping and stumbling without subsequent striking against object, initial encounter: Secondary | ICD-10-CM | POA: Insufficient documentation

## 2017-06-08 DIAGNOSIS — Y999 Unspecified external cause status: Secondary | ICD-10-CM | POA: Diagnosis not present

## 2017-06-08 DIAGNOSIS — S8991XA Unspecified injury of right lower leg, initial encounter: Secondary | ICD-10-CM

## 2017-06-08 DIAGNOSIS — Y92810 Car as the place of occurrence of the external cause: Secondary | ICD-10-CM | POA: Diagnosis not present

## 2017-06-08 DIAGNOSIS — I509 Heart failure, unspecified: Secondary | ICD-10-CM | POA: Diagnosis not present

## 2017-06-08 DIAGNOSIS — Z85038 Personal history of other malignant neoplasm of large intestine: Secondary | ICD-10-CM | POA: Insufficient documentation

## 2017-06-08 HISTORY — DX: Unspecified dementia, unspecified severity, without behavioral disturbance, psychotic disturbance, mood disturbance, and anxiety: F03.90

## 2017-06-08 MED ORDER — ACETAMINOPHEN 325 MG PO TABS
650.0000 mg | ORAL_TABLET | Freq: Once | ORAL | Status: AC
  Administered 2017-06-08: 650 mg via ORAL
  Filled 2017-06-08: qty 2

## 2017-06-08 NOTE — ED Triage Notes (Signed)
Pt fell trying to get into car yesterday. Pain with walking. Has dementia

## 2017-06-08 NOTE — ED Provider Notes (Signed)
[facility] Emergency Department Provider Note       Time seen: ----------------------------------------- 11:31 AM on 06/08/2017 -----------------------------------------   I have reviewed the triage vital signs and the nursing notes.  HISTORY   Chief Complaint Fall    HPI Kayla Pace is a 84 y.o. female with a history of anxiety, depression, CHF, dementia, hyperlipidemia who presents to the ED for a fall that occurred while trying to get into the car yesterday.  Patient has pain with walking particularly in the right leg.  She has a history of dementia so no further information is available from her.  Her daughter states she fell forward trying to get into a truck.  Past Medical History:  Diagnosis Date  . Anxiety   . Anxiety and depression   . Arthritis   . Cancer of skin of neck 1983   "right side"  . CHF (congestive heart failure) ([place])   . Colon cancer ([place]) ~ 2015  . Dementia   . Depression   . Fall at home ?12/05/2015   minimally displaced tibial tuberosity fracture right knee  . High cholesterol   . Irregular heart beats   . Meningitis 1926   caused right foot deformity   . Occasional tremors     Patient Active Problem List   Diagnosis Date Noted  . Failure to thrive in adult 12/07/2015    Past Surgical History:  Procedure Laterality Date  . CATARACT EXTRACTION W/ INTRAOCULAR LENS  IMPLANT, BILATERAL Bilateral   . COLON SURGERY  2016   "had part of it taken out; in [place]"  . FRACTURE SURGERY    . HIP FRACTURE SURGERY Right   . SKIN CANCER EXCISION  1983   "took radiation shots too"    Allergies Codeine and Morphine  Social History Social History   Tobacco Use  . Smoking status: Never Smoker  . Smokeless tobacco: Never Used  Substance Use Topics  . Alcohol use: No  . Drug use: No    Review of Systems Constitutional: Negative for fever. Cardiovascular: Negative for chest pain. Respiratory: Negative for  shortness of breath. Gastrointestinal: Negative for abdominal pain, vomiting and diarrhea. Musculoskeletal: Positive right leg pain Skin: Positive for contusions Neurological: Negative for headaches, focal weakness or numbness.  All systems negative/normal/unremarkable except as stated in the HPI  ____________________________________________   PHYSICAL EXAM:  VITAL SIGNS: ED Triage Vitals  Enc Vitals Group     BP 06/08/17 1130 (!) 191/82     Pulse Rate 06/08/17 1130 66     Resp 06/08/17 1130 20     Temp 06/08/17 1130 98.2 F (36.8 C)     Temp Source 06/08/17 1130 Oral     SpO2 06/08/17 1130 99 %     Weight 06/08/17 1129 98 lb 9.6 oz (44.7 kg)     Height 06/08/17 1129 5' (1.524 m)     Head Circumference --      Peak Flow --      Pain Score --      Pain Loc --      Pain Edu? --      Excl. in [place]? --    Constitutional: Alert but disoriented.  Well appearing and in no distress. Cardiovascular: Normal rate, regular rhythm. No murmurs, rubs, or gallops. Respiratory: Normal respiratory effort without tachypnea nor retractions. Breath sounds are clear and equal bilaterally. No wheezes/rales/rhonchi. Gastrointestinal: Soft and nontender. Normal bowel sounds Musculoskeletal: Tenderness noted particularly over the right knee and  distal femur and proximal tibia.  Mild erythema noted over the right knee.  Pain with range of motion the right knee.  Remainder of bilateral lower extremity exams is unremarkable. Neurologic:  Normal speech and language. No gross focal neurologic deficits are appreciated.  Skin: Mild erythema is noted in the right knee ____________________________________________  ED COURSE:  As part of my medical decision making, I reviewed the following data within the [place] History obtained from family if available, nursing notes, old chart and ekg, as well as notes from prior ED visits. Patient presented for right leg pain after fall, we will assess  with imaging as indicated at this time.   Procedures  RADIOLOGY Images were viewed by me  Tib-fib, right femur x-rays Reveal arthritis but no other acute process ____________________________________________  DIFFERENTIAL DIAGNOSIS   Contusion, fracture, dislocation, muscle strain, arthritis  FINAL ASSESSMENT AND PLAN  Right leg injury, osteoarthritis   Plan: Patient had presented for leg injury.  Patient's imaging revealed osteoarthritis without acute fracture.  She is able to ambulate with some pain.  We placed an Ace wrap on her and she will be referred to orthopedics for outpatient follow-up.   Laurence Aly, MD   Note: This note was generated in part or whole with voice recognition software. Voice recognition is usually quite accurate but there are transcription errors that can and very often do occur. I apologize for any typographical errors that were not detected and corrected.     Earleen Newport, MD 06/08/17 [phone]

## 2017-06-08 NOTE — ED Notes (Signed)
Family remains at bedside with pt.

## 2017-07-04 ENCOUNTER — Other Ambulatory Visit: Payer: Self-pay

## 2017-07-04 ENCOUNTER — Emergency Department: Payer: Medicare Other

## 2017-07-04 ENCOUNTER — Emergency Department
Admission: EM | Admit: 2017-07-04 | Discharge: 2017-07-04 | Disposition: A | Discharge status: Home / Self Care | Payer: Medicare Other | Attending: Emergency Medicine | Admitting: Emergency Medicine

## 2017-07-04 ENCOUNTER — Encounter: Payer: Self-pay | Admitting: Emergency Medicine

## 2017-07-04 DIAGNOSIS — F0390 Unspecified dementia without behavioral disturbance: Secondary | ICD-10-CM | POA: Diagnosis not present

## 2017-07-04 DIAGNOSIS — Z7982 Long term (current) use of aspirin: Secondary | ICD-10-CM | POA: Diagnosis not present

## 2017-07-04 DIAGNOSIS — Z85828 Personal history of other malignant neoplasm of skin: Secondary | ICD-10-CM | POA: Diagnosis not present

## 2017-07-04 DIAGNOSIS — R197 Diarrhea, unspecified: Secondary | ICD-10-CM | POA: Insufficient documentation

## 2017-07-04 DIAGNOSIS — Z79899 Other long term (current) drug therapy: Secondary | ICD-10-CM | POA: Insufficient documentation

## 2017-07-04 DIAGNOSIS — R1084 Generalized abdominal pain: Secondary | ICD-10-CM | POA: Insufficient documentation

## 2017-07-04 DIAGNOSIS — I509 Heart failure, unspecified: Secondary | ICD-10-CM | POA: Diagnosis not present

## 2017-07-04 LAB — COMPREHENSIVE METABOLIC PANEL
ALK PHOS: 97 U/L (ref 38–126)
ALT: 32 U/L (ref 14–54)
AST: 51 U/L — AB (ref 15–41)
Albumin: 4.1 g/dL (ref 3.5–5.0)
Anion gap: 9 (ref 5–15)
BILIRUBIN TOTAL: 0.5 mg/dL (ref 0.3–1.2)
BUN: 8 mg/dL (ref 6–20)
CO2: 30 mmol/L (ref 22–32)
CREATININE: 0.59 mg/dL (ref 0.44–1.00)
Calcium: 9.1 mg/dL (ref 8.9–10.3)
Chloride: 99 mmol/L — ABNORMAL LOW (ref 101–111)
GFR calc Af Amer: >60 mL/min (ref >60)
GLUCOSE: 100 mg/dL — AB (ref 65–99)
Potassium: 4.5 mmol/L (ref 3.5–5.1)
Sodium: 138 mmol/L (ref 135–145)
TOTAL PROTEIN: 8 g/dL (ref 6.5–8.1)

## 2017-07-04 LAB — CBC
HCT: 48.1 % — ABNORMAL HIGH (ref 35.0–47.0)
Hemoglobin: 15.6 g/dL (ref 12.0–16.0)
MCH: 32 pg (ref 26.0–34.0)
MCHC: 32.5 g/dL (ref 32.0–36.0)
MCV: 98.6 fL (ref 80.0–100.0)
PLATELETS: 232 10*3/uL (ref 150–440)
RBC: 4.88 MIL/uL (ref 3.80–5.20)
RDW: 13.9 % (ref 11.5–14.5)
WBC: 5.4 10*3/uL (ref 3.6–11.0)

## 2017-07-04 LAB — URINALYSIS, ROUTINE W REFLEX MICROSCOPIC
BILIRUBIN URINE: NEGATIVE
Glucose, UA: NEGATIVE mg/dL
Hgb urine dipstick: NEGATIVE
KETONES UR: NEGATIVE mg/dL
Leukocytes, UA: NEGATIVE
NITRITE: NEGATIVE
PROTEIN: NEGATIVE mg/dL
Specific Gravity, Urine: 1.005 (ref 1.005–1.030)
pH: 7 (ref 5.0–8.0)

## 2017-07-04 LAB — LIPASE, BLOOD: Lipase: 39 U/L (ref 11–51)

## 2017-07-04 MED ORDER — SODIUM CHLORIDE 0.9 % IV BOLUS (SEPSIS)
500.0000 mL | INTRAVENOUS | Status: AC
  Administered 2017-07-04: 500 mL via INTRAVENOUS

## 2017-07-04 MED ORDER — CIPROFLOXACIN HCL 500 MG PO TABS: 500.0000 mg | ORAL_TABLET | Freq: Two times a day (BID) | ORAL | 0 refills | Status: AC

## 2017-07-04 MED ORDER — IOPAMIDOL (ISOVUE-300) INJECTION 61%
75.0000 mL | Freq: Once | INTRAVENOUS | Status: AC | PRN
  Administered 2017-07-04: 75 mL via INTRAVENOUS

## 2017-07-04 NOTE — ED Provider Notes (Signed)
[facility] Emergency Department Provider Note  ____________________________________________   First MD Initiated Contact with Patient 07/04/17 1537     (approximate)  I have reviewed the triage vital signs and the nursing notes.   HISTORY  Chief Complaint Diarrhea   Level 5 caveat:  history/ROS limited by chronic dementia   HPI Kayla Pace is a 84 y.o. female with extensive past medical history as listed below who presents for evaluation of severe explosive and watery diarrhea times 7 days.  She saw her primary care doctor yesterday and was diagnosed with a urinary tract infection as well as oral thrush.  She was prescribed antibiotics but has not had the opportunity to fill them yet.  Her daughter brought her in today because the diarrhea occurs multiple times per day and is large volume and "just like water".  The patient is not having any nausea or vomiting.  She has intermittent abdominal cramping but no persistent pain.  She denies fever/chills, chest pain, shortness of breath, and dysuria although she has had increased urinary frequency.  Her daughter describes the symptoms as severe and nothing has helped or hurt although she has not been on any medication yet.   Past Medical History:  Diagnosis Date  . Anxiety   . Anxiety and depression   . Arthritis   . Cancer of skin of neck 1983   "right side"  . CHF (congestive heart failure) ([place])   . Colon cancer ([place]) ~ 2015  . Dementia   . Depression   . Fall at home ?12/05/2015   minimally displaced tibial tuberosity fracture right knee  . High cholesterol   . Irregular heart beats   . Meningitis 1926   caused right foot deformity   . Occasional tremors     Patient Active Problem List   Diagnosis Date Noted  . Failure to thrive in adult 12/07/2015    Past Surgical History:  Procedure Laterality Date  . CATARACT EXTRACTION W/ INTRAOCULAR LENS  IMPLANT, BILATERAL Bilateral   . COLON SURGERY   2016   "had part of it taken out; in [place]"  . FRACTURE SURGERY    . HIP FRACTURE SURGERY Right   . SKIN CANCER EXCISION  1983   "took radiation shots too"    Prior to Admission medications   Medication Sig Start Date End Date Taking? Authorizing Provider  Ascorbic Acid (VITAMIN C) 1000 MG tablet Take 1 tablet by mouth daily.   Yes [provider]  aspirin 81 MG tablet Take 1 tablet by mouth daily. 02/09/08  Yes [provider]  Calcium Carbonate-Vitamin D (CALCIUM HIGH POTENCY/VITAMIN D) 600-200 MG-UNIT TABS Take 1 tablet by mouth 2 (two) times daily. 10/03/16 10/03/17 Yes [provider]  citalopram (CELEXA) 10 MG tablet Take 5-10 mg by mouth daily. 10mg -AM 5mg -PM 05/30/17  Yes [provider]  clonazePAM (KLONOPIN) 0.5 MG tablet Take 1 tablet by mouth at bedtime.   Yes [provider]  clotrimazole (LOTRIMIN) 1 % cream Apply 1 application topically as directed. 03/19/17  Yes [provider]  digoxin (LANOXIN) 0.125 MG tablet Take 1 tablet by mouth daily. 03/25/12  Yes [provider]  memantine (NAMENDA) 5 MG tablet Take 5 mg by mouth 4 (four) times daily. 03/17/17  Yes [provider]  metoprolol succinate (TOPROL-XL) 25 MG 24 hr tablet Take 1 tablet by mouth daily. 02/01/13  Yes [provider]  Multiple Vitamins-Minerals (CENTRUM SILVER ADULT 50+) TABS Take 1  tablet by mouth daily.   Yes [provider]  primidone (MYSOLINE) 250 MG tablet Take 1 tablet by mouth 2 (two) times daily. 02/08/13  Yes [provider]  simvastatin (ZOCOR) 10 MG tablet Take 1 tablet by mouth at bedtime. 03/25/12  Yes [provider]  ciprofloxacin (CIPRO) 500 MG tablet Take 1 tablet (500 mg total) by mouth 2 (two) times daily for 6 days. 07/04/17 07/10/17  Hinda Kehr, MD  fluticasone (FLONASE) 50 MCG/ACT nasal spray Place 2 sprays into the nose daily as needed for allergies.  03/25/12   [provider]  nystatin (MYCOSTATIN) 100000 UNIT/ML suspension Take 4 mLs by mouth 4 (four) times daily. 07/03/17   [provider]  ondansetron (ZOFRAN) 4 MG tablet Take 1 tablet (4 mg total) by mouth every 8 (eight) hours as needed for nausea or vomiting. Patient not taking: Reported on 06/08/2017 12/05/15   Mackuen, Fredia Sorrow, MD  oxyCODONE-acetaminophen (PERCOCET/ROXICET) 5-325 MG tablet Take 1 tablet by mouth every 6 (six) hours as needed for severe pain. Patient not taking: Reported on 06/08/2017 12/05/15   Macarthur Critchley, MD    Allergies Codeine and Morphine  No family history on file.  Social History Social History   Tobacco Use  . Smoking status: Never Smoker  . Smokeless tobacco: Never Used  Substance Use Topics  . Alcohol use: No  . Drug use: No    Review of Systems Level 5 caveat:  history/ROS limited by chronic dementia  Constitutional: No fever/chills Eyes: No visual changes. ENT: No sore throat. Cardiovascular: Denies chest pain. Respiratory: Denies shortness of breath. Gastrointestinal: Severe profuse diarrhea over the last week, gradually getting worse, watery and nonbloody Genitourinary: Increased urinary frequency but negative for dysuria. Musculoskeletal: Negative for neck pain.  Negative for back pain. Integumentary: Negative for rash. Neurological: Negative for headaches, focal weakness or numbness.   ____________________________________________   PHYSICAL EXAM:  VITAL SIGNS: ED Triage Vitals  Enc Vitals Group     BP 07/04/17 1428 (!) 177/85     Pulse Rate 07/04/17 1428 68     Resp 07/04/17 1428 18     Temp 07/04/17 1428 97.6 F (36.4 C)     Temp Source 07/04/17 1428 Oral     SpO2 07/04/17 1428 98 %     Weight 07/04/17 1429 44.7 kg (98 lb 9.6 oz)     Height 07/04/17 1429 1.549 m (5\' 1" )     Head Circumference --      Peak Flow --      Pain Score --      Pain Loc --      Pain Edu? --      Excl. in [place]? --      Constitutional: Alert and oriented.  Elderly but well appearing and in no acute distress. Eyes: Conjunctivae are normal.  Head: Atraumatic. Nose: No congestion/rhinnorhea. Mouth/Throat: Mucous membranes are moist. Neck: No stridor.  No meningeal signs.   Cardiovascular: Normal rate, regular rhythm. Good peripheral circulation. Grossly normal heart sounds. Respiratory: Normal respiratory effort.  No retractions. Lungs CTAB. Gastrointestinal: Soft and nontender. No distention.  Musculoskeletal: No lower extremity tenderness nor edema. No gross deformities of extremities. Neurologic:  Normal speech and language. No gross focal neurologic deficits are appreciated.  Skin:  Skin is warm, dry and intact. No rash noted. Psychiatric: Mood and affect are normal. Speech and behavior are normal.  ____________________________________________   LABS (all labs ordered are listed, but only abnormal results are  displayed)  Labs Reviewed  COMPREHENSIVE METABOLIC PANEL - Abnormal; Notable for the following components:      Result Value   Chloride 99 (*)    Glucose, Bld 100 (*)    AST 51 (*)    All other components within normal limits  CBC - Abnormal; Notable for the following components:   HCT 48.1 (*)    All other components within normal limits  URINALYSIS, ROUTINE W REFLEX MICROSCOPIC - Abnormal; Notable for the following components:   Color, Urine YELLOW (*)    APPearance CLEAR (*)    All other components within normal limits  GASTROINTESTINAL PANEL BY PCR, STOOL (REPLACES STOOL CULTURE)  C DIFFICILE QUICK SCREEN W PCR REFLEX  URINE CULTURE  LIPASE, BLOOD   ____________________________________________  EKG  None - EKG not ordered by ED physician ____________________________________________  RADIOLOGY   ED MD interpretation: No acute abnormalities on CT scan  Official radiology report(s): Ct Abdomen Pelvis W Contrast  Result Date: 07/04/2017 CLINICAL DATA:  Watery  diarrhea EXAM: CT ABDOMEN AND PELVIS WITH CONTRAST TECHNIQUE: Multidetector CT imaging of the abdomen and pelvis was performed using the standard protocol following bolus administration of intravenous contrast. CONTRAST:  70mL ISOVUE-300 IOPAMIDOL (ISOVUE-300) INJECTION 61% COMPARISON:  Radiographs 06/08/2017 FINDINGS: Lower chest: Lung bases demonstrate no acute consolidation or pleural effusion. Atelectasis or scar at the left base. Mild cardiomegaly. Hepatobiliary: Septated cystic density at the dome of the liver. No calcified gallstones or biliary dilatation. Pancreas: Unremarkable. No pancreatic ductal dilatation or surrounding inflammatory changes. Spleen: Normal in size without focal abnormality. Adrenals/Urinary Tract: Adrenal glands are within normal limits. No hydronephrosis. Cysts within the kidneys. Subcentimeter hypodensities too small to further characterize. The bladder is normal Stomach/Bowel: Stomach is nonenlarged. No dilated small bowel. Postsurgical changes of the right colon. No colon wall thickening. Sigmoid colon diverticula without acute inflammation Vascular/Lymphatic: Moderate aortic atherosclerosis. No aneurysmal dilatation. No significantly enlarged lymph nodes. Reproductive: Uterus and bilateral adnexa are unremarkable. Other: Negative for free air or free fluid. Musculoskeletal: Degenerative changes of the spine. Trace retrolisthesis of L3 on L4. Status post right hip replacement with normal alignment IMPRESSION: 1. Postsurgical changes of the right colon. No evidence for a bowel obstruction or bowel wall thickening. 2. Sigmoid colon diverticula without acute inflammation Electronically Signed   By: Donavan Foil M.D.   On: 07/04/2017 16:35    ____________________________________________   PROCEDURES  Critical Care performed: No   Procedure(s) performed:   Procedures   ____________________________________________   INITIAL IMPRESSION / ASSESSMENT AND PLAN / ED  COURSE  As part of my medical decision making, I reviewed the following data within the [place] History obtained from family, Nursing notes reviewed and incorporated, Labs reviewed , Old chart reviewed and Notes from prior ED visits    Differential diagnosis includes, but is not limited to, viral GI illness, food poisoning, bacterial GI illness, urinary tract infection, bowel obstruction or ileus (less likely given the diarrhea but still possible), ischemic colitis.  The patient has no signs or symptoms of sepsis.  Lab work is all reassuring with no leukocytosis and normal kidney function.  Vital signs are stable and afebrile.  I will give her a small fluid bolus given her GI losses and given her age and intermittent abdominal pain and diarrhea I will scan her abdomen and pelvis with IV contrast only.  I have ordered stool studies including C. difficile and GI pathogen panel but thus far the patient has not been  able to provide a sample.  Clinical Course as of Jul 04 2028  Fri Jul 04, 2017  1731 CT scan reassuring with no acute abnormalities.  The patient has had several more episodes of need to urinate but has not yet had a bowel movement.  I updated the daughter and we will continue to monitor to see if she can provide a stool sample.  Urinalysis is still pending, but given that she is having polyuria and was reportedly diagnosed with a UTI yesterday at her primary care physician, I may suggest that she take Cipro.  Though this is no longer first choice for urinary tract infections, it should be effective, and it may also help with GI pathogens.  This will be dependent upon whether or not we can get a stool sample.  [CF]    Clinical Course User Index [CF] Hinda Kehr, MD    ____________________________________________  FINAL CLINICAL IMPRESSION(S) / ED DIAGNOSES  Final diagnoses:  Diarrhea, unspecified type     MEDICATIONS GIVEN DURING THIS VISIT:  Medications   sodium chloride 0.9 % bolus 500 mL (0 mLs Intravenous Stopped 07/04/17 1831)  iopamidol (ISOVUE-300) 61 % injection 75 mL (75 mLs Intravenous Contrast Given 07/04/17 1617)     ED Discharge Orders        Ordered    ciprofloxacin (CIPRO) 500 MG tablet  2 times daily     07/04/17 1847       Note:  This document was prepared using Dragon voice recognition software and may include unintentional dictation errors.    Hinda Kehr, MD 07/04/17 2030

## 2017-07-04 NOTE — Discharge Instructions (Signed)
As we discussed, your workup was reassuring today.  There are no lab abnormalities to suggest an acute or emergent problem at this time.  Fortunately you were not able to provide a stool sample for [place] to test, but this may be a good thing and may indicate that your diarrhea is starting to slow down on its own.  Your urinalysis was actually clean with no evidence of infection.  We understand that you had some leukocytes (white blood cells) urine at the primary care doctor yesterday.  Although we typically use Keflex for urinary tract infections, just as prescribed by Dr. Kym Groom, we would recommend not taking the Keflex in favor of the Cipro I prescribed today.  This is because it may treat a gastrointestinal infection as well, and since you have had diarrhea for 7 days it is worth a try to see if it will improve your symptoms.  Please take the full course of treatment (6 days) and follow-up with Dr. Kym Groom at the next available opportunity.  Make sure to drink plenty of fluids to stay hydrated and read through the included information for additional recommendations and instructions.    Return to the emergency department if you develop new or worsening symptoms that concern you.

## 2017-07-04 NOTE — ED Triage Notes (Signed)
FIRST NURSE NOTE-watery diarrhea X 7 days per daughter.  Weak. Fallen. Alert at check in

## 2017-07-04 NOTE — ED Triage Notes (Signed)
Pt in via POV from home with complaints of severe watery diarrhea x 7 days.  Pt seen by PCP yesterday, dx with UTI, thrush, pt to start antibiotics today but has not yet done so.  Per daughter, pt has complained of abdominal pain, pt denies any pain at this time.  Vitals WDL, NAD noted at this time.

## 2017-07-04 NOTE — ED Notes (Signed)
Pt c/o diarrhea x1 wk. Pt denies any n/v and was referred to the ED per PCP.

## 2017-07-06 LAB — URINE CULTURE: Special Requests: NORMAL

## 2018-01-30 ENCOUNTER — Encounter (HOSPITAL_COMMUNITY): Payer: Self-pay | Admitting: Nurse Practitioner

## 2018-01-30 ENCOUNTER — Emergency Department (HOSPITAL_COMMUNITY)
Admission: EM | Admit: 2018-01-30 | Discharge: 2018-01-31 | Disposition: A | Discharge status: Skilled Nursing Facility | Payer: Medicare Other | Attending: Emergency Medicine | Admitting: Emergency Medicine

## 2018-01-30 ENCOUNTER — Emergency Department (HOSPITAL_COMMUNITY): Payer: Medicare Other

## 2018-01-30 DIAGNOSIS — F0390 Unspecified dementia without behavioral disturbance: Secondary | ICD-10-CM | POA: Diagnosis not present

## 2018-01-30 DIAGNOSIS — Z79899 Other long term (current) drug therapy: Secondary | ICD-10-CM | POA: Insufficient documentation

## 2018-01-30 DIAGNOSIS — Y939 Activity, unspecified: Secondary | ICD-10-CM | POA: Insufficient documentation

## 2018-01-30 DIAGNOSIS — I509 Heart failure, unspecified: Secondary | ICD-10-CM | POA: Insufficient documentation

## 2018-01-30 DIAGNOSIS — Y999 Unspecified external cause status: Secondary | ICD-10-CM | POA: Insufficient documentation

## 2018-01-30 DIAGNOSIS — Z7982 Long term (current) use of aspirin: Secondary | ICD-10-CM | POA: Insufficient documentation

## 2018-01-30 DIAGNOSIS — S0990XA Unspecified injury of head, initial encounter: Secondary | ICD-10-CM | POA: Insufficient documentation

## 2018-01-30 DIAGNOSIS — Z85038 Personal history of other malignant neoplasm of large intestine: Secondary | ICD-10-CM | POA: Diagnosis not present

## 2018-01-30 DIAGNOSIS — R51 Headache: Secondary | ICD-10-CM | POA: Diagnosis not present

## 2018-01-30 DIAGNOSIS — Y92129 Unspecified place in nursing home as the place of occurrence of the external cause: Secondary | ICD-10-CM | POA: Insufficient documentation

## 2018-01-30 DIAGNOSIS — W07XXXA Fall from chair, initial encounter: Secondary | ICD-10-CM | POA: Diagnosis not present

## 2018-01-30 DIAGNOSIS — Z85828 Personal history of other malignant neoplasm of skin: Secondary | ICD-10-CM | POA: Insufficient documentation

## 2018-01-30 DIAGNOSIS — S0003XA Contusion of scalp, initial encounter: Secondary | ICD-10-CM | POA: Diagnosis not present

## 2018-01-30 DIAGNOSIS — W19XXXA Unspecified fall, initial encounter: Secondary | ICD-10-CM

## 2018-01-30 MED ORDER — ACETAMINOPHEN 500 MG PO TABS
1000.0000 mg | ORAL_TABLET | Freq: Once | ORAL | Status: AC
  Administered 2018-01-30: 1000 mg via ORAL
  Filled 2018-01-30: qty 2

## 2018-01-30 NOTE — ED Notes (Signed)
ED EKG saved

## 2018-01-30 NOTE — ED Provider Notes (Signed)
[facility] DEPT Provider Note   CSN: [id number] Arrival date & time: 01/30/18  2103     History   Chief Complaint Chief Complaint  Patient presents with  . Fall    HPI Taleia Kimmey is a 84 y.o. female.  The history is provided by the patient and medical records.  Fall  Associated symptoms include headaches.     84 year old female with history of anxiety, depression, arthritis, dementia, frequent falls, baseline tremors, presenting to the ED after a fall.  [facility]  Daughter reports generally she uses her wheelchair but often tries to get up and walk with her walker independently which she is not supposed to do.  Today, she was sitting in her chair and was sorting through some papers.  She leaned forward to try to take her shoes off and fell forward out of the chair striking the front of her head on the floor in the top of her head on the fireplace at the facility.  She did not lose consciousness.  States she cried out immediately for help.  CNA came and stay with her until EMS arrived.  Patient's only complaint at this time is a headache on the top of her head.  Daughter reports she is at her baseline.  She has a skin tear of the right lower leg but denies pain in this area.  She is on aspirin but no other anticoagulation.  Past Medical History:  Diagnosis Date  . Anxiety   . Anxiety and depression   . Arthritis   . Cancer of skin of neck 1983   "right side"  . CHF (congestive heart failure) ([place])   . Colon cancer ([place]) ~ 2015  . Dementia   . Depression   . Fall at home ?12/05/2015   minimally displaced tibial tuberosity fracture right knee  . High cholesterol   . Irregular heart beats   . Meningitis 1926   caused right foot deformity   . Occasional tremors     Patient Active Problem List   Diagnosis Date Noted  . Failure to thrive in adult 12/07/2015    Past Surgical History:  Procedure  Laterality Date  . CATARACT EXTRACTION W/ INTRAOCULAR LENS  IMPLANT, BILATERAL Bilateral   . COLON SURGERY  2016   "had part of it taken out; in [place]"  . FRACTURE SURGERY    . HIP FRACTURE SURGERY Right   . SKIN CANCER EXCISION  1983   "took radiation shots too"     OB History   None      Home Medications    Prior to Admission medications   Medication Sig Start Date End Date Taking? Authorizing Provider  acetaminophen (TYLENOL) 500 MG tablet Take 500 mg by mouth every 6 (six) hours as needed for moderate pain.   Yes [provider]  Ascorbic Acid (VITAMIN C) 1000 MG tablet Take 1 tablet by mouth daily.   Yes [provider]  aspirin 81 MG tablet Take 1 tablet by mouth daily. 02/09/08  Yes [provider]  Cholecalciferol (VITAMIN D PO) Take 1 tablet by mouth daily.   Yes [provider]  citalopram (CELEXA) 10 MG tablet Take 5-10 mg by mouth daily. 10mg -AM 5mg -PM 05/30/17  Yes [provider]  clotrimazole (LOTRIMIN) 1 % cream Apply 1 application topically as directed. 03/19/17  Yes [provider]  digoxin (LANOXIN) 0.125 MG tablet Take 1 tablet by mouth  daily. 03/25/12  Yes [provider]  Ibuprofen-diphenhydrAMINE HCl (ADVIL PM) 200-25 MG CAPS Take 2 capsules by mouth at bedtime.   Yes [provider]  loperamide (IMODIUM A-D) 2 MG tablet Take 2 mg by mouth 4 (four) times daily as needed for diarrhea or loose stools.   Yes [provider]  Melatonin 3 MG TABS Take 1 tablet by mouth at bedtime.   Yes [provider]  memantine (NAMENDA) 5 MG tablet Take 5 mg by mouth 4 (four) times daily. 03/17/17  Yes [provider]  Multiple Vitamins-Minerals (ADULT ONE DAILY GUMMIES PO) Take 1 tablet by mouth daily.   Yes [provider]  Multiple Vitamins-Minerals (CENTRUM SILVER ADULT 50+) TABS Take 1 tablet by mouth daily.   Yes [provider]  omeprazole (PRILOSEC)  20 MG capsule Take 20 mg by mouth daily.  01/02/18  Yes [provider]  primidone (MYSOLINE) 250 MG tablet Take 1 tablet by mouth 2 (two) times daily. 02/08/13  Yes [provider]  simvastatin (ZOCOR) 10 MG tablet Take 1 tablet by mouth at bedtime. 03/25/12  Yes [provider]  ondansetron (ZOFRAN) 4 MG tablet Take 1 tablet (4 mg total) by mouth every 8 (eight) hours as needed for nausea or vomiting. Patient not taking: Reported on 06/08/2017 12/05/15   Mackuen, Fredia Sorrow, MD  oxyCODONE-acetaminophen (PERCOCET/ROXICET) 5-325 MG tablet Take 1 tablet by mouth every 6 (six) hours as needed for severe pain. Patient not taking: Reported on 06/08/2017 12/05/15   Macarthur Critchley, MD    Family History History reviewed. No pertinent family history.  Social History Social History   Tobacco Use  . Smoking status: Never Smoker  . Smokeless tobacco: Never Used  Substance Use Topics  . Alcohol use: No  . Drug use: No     Allergies   Codeine and Morphine   Review of Systems Review of Systems  Neurological: Positive for headaches.  All other systems reviewed and are negative.    Physical Exam Updated Vital Signs BP (!) 183/85 (BP Location: Left Arm)   Pulse 72   Temp 97.8 F (36.6 C) (Oral)   Resp 19   Ht 4\' 11"  (1.499 m)   Wt 44.6 kg   SpO2 100%   BMI 19.87 kg/m   Physical Exam  Constitutional: She is oriented to person, place, and time. She appears well-developed and well-nourished.  HENT:  Head: Normocephalic and atraumatic.  Mouth/Throat: Oropharynx is clear and moist.  Hematoma to right forehead and top of the head, areas are locally tender; no open wounds or lacerations  Eyes: Pupils are equal, round, and reactive to light. Conjunctivae and EOM are normal.  Neck: Normal range of motion.  Cardiovascular: Normal rate, regular rhythm and normal heart sounds.  Pulmonary/Chest: Effort normal and breath sounds normal. No stridor. No respiratory  distress.  Abdominal: Soft. Bowel sounds are normal. There is no tenderness. There is no rebound.  Musculoskeletal: Normal range of motion.  Small skin tear of right shin that is bandaged; non-tender, no bony deformity Pelvis stable, hips non-tender  Neurological: She is alert and oriented to person, place, and time.  Awake, alert, oriented to baseline; answering questions and following commands without issue  Skin: Skin is warm and dry.  Psychiatric: She has a normal mood and affect.  Nursing note and vitals reviewed.    ED Treatments / Results  Labs (all labs ordered are listed, but only abnormal results are displayed) Labs Reviewed -  No data to display  EKG None  Radiology Ct Head Wo Contrast  Result Date: 01/30/2018 CLINICAL DATA:  Fall, head injury, headache, neck pain EXAM: CT HEAD WITHOUT CONTRAST CT CERVICAL SPINE WITHOUT CONTRAST TECHNIQUE: Multidetector CT imaging of the head and cervical spine was performed following the standard protocol without intravenous contrast. Multiplanar CT image reconstructions of the cervical spine were also generated. COMPARISON:  None. FINDINGS: CT HEAD FINDINGS Brain: No evidence of acute infarction, hemorrhage, extra-axial collection or mass lesion/mass effect. Global cortical and central atrophy.  Secondary ventriculomegaly. Extensive small vessel ischemic changes. Vascular: No hyperdense vessel or unexpected calcification. Skull: Normal. Negative for fracture or focal lesion. Sinuses/Orbits: Partial opacification of the right sphenoid sinus. Mastoid air cells are clear. Other: Soft tissue swelling/hematoma overlying the right vertex (series 3/image 28; series 6/image 21). CT CERVICAL SPINE FINDINGS Alignment: Normal cervical lordosis. Skull base and vertebrae: No acute fracture. No primary bone lesion or focal pathologic process. Soft tissues and spinal canal: No prevertebral fluid or swelling. No visible canal hematoma. Disc levels: Mild to  moderate multilevel degenerative changes, most prominent at C4-5. Spinal canal is patent. Upper chest: Biapical pleural-parenchymal scarring. Other: Visualized thyroid is unremarkable. IMPRESSION: Soft tissue swelling/hematoma overlying the right vertex. No evidence of calvarial fracture. No evidence of acute intracranial abnormality. Atrophy with secondary ventriculomegaly. Extensive small vessel ischemic changes. No evidence of traumatic injury to the cervical spine. Mild to moderate multilevel degenerative changes. Electronically Signed   By: Julian Hy M.D.   On: 01/30/2018 23:40   Ct Cervical Spine Wo Contrast  Result Date: 01/30/2018 CLINICAL DATA:  Fall, head injury, headache, neck pain EXAM: CT HEAD WITHOUT CONTRAST CT CERVICAL SPINE WITHOUT CONTRAST TECHNIQUE: Multidetector CT imaging of the head and cervical spine was performed following the standard protocol without intravenous contrast. Multiplanar CT image reconstructions of the cervical spine were also generated. COMPARISON:  None. FINDINGS: CT HEAD FINDINGS Brain: No evidence of acute infarction, hemorrhage, extra-axial collection or mass lesion/mass effect. Global cortical and central atrophy.  Secondary ventriculomegaly. Extensive small vessel ischemic changes. Vascular: No hyperdense vessel or unexpected calcification. Skull: Normal. Negative for fracture or focal lesion. Sinuses/Orbits: Partial opacification of the right sphenoid sinus. Mastoid air cells are clear. Other: Soft tissue swelling/hematoma overlying the right vertex (series 3/image 28; series 6/image 21). CT CERVICAL SPINE FINDINGS Alignment: Normal cervical lordosis. Skull base and vertebrae: No acute fracture. No primary bone lesion or focal pathologic process. Soft tissues and spinal canal: No prevertebral fluid or swelling. No visible canal hematoma. Disc levels: Mild to moderate multilevel degenerative changes, most prominent at C4-5. Spinal canal is patent. Upper  chest: Biapical pleural-parenchymal scarring. Other: Visualized thyroid is unremarkable. IMPRESSION: Soft tissue swelling/hematoma overlying the right vertex. No evidence of calvarial fracture. No evidence of acute intracranial abnormality. Atrophy with secondary ventriculomegaly. Extensive small vessel ischemic changes. No evidence of traumatic injury to the cervical spine. Mild to moderate multilevel degenerative changes. Electronically Signed   By: Julian Hy M.D.   On: 01/30/2018 23:40    Procedures Procedures (including critical care time)  Medications Ordered in ED Medications  acetaminophen (TYLENOL) tablet 1,000 mg (1,000 mg Oral Given 01/30/18 2255)  citalopram (CELEXA) tablet 5 mg (5 mg Oral Given 01/31/18 0111)  simvastatin (ZOCOR) tablet 10 mg (10 mg Oral Given 01/31/18 0112)  primidone (MYSOLINE) tablet 250 mg (250 mg Oral Given 01/31/18 0112)     Initial Impression / Assessment and Plan / ED Course  I have reviewed the triage  vital signs and the nursing notes.  Pertinent labs & imaging results that were available during my care of the patient were reviewed by me and considered in my medical decision making (see chart for details).  84 year old female here after a fall.  Lean too far forward in her wheelchair and struck her forehead on the floor in the top of her head against the fireplace.  No loss of consciousness.  Denies any other injuries.  On exam she is awake, alert, oriented to her baseline.  She is able to answer questions and follow commands.  Does have hematoma to the right forehead as well as one on top of her head.  These are locally tender.  There is no open wound or laceration that requires repair.  Takes daily aspirin but no other anticoagulation.  Does have small skin tear of the right shin without focal tenderness to palpation or bony deformity.  This is already been bandaged prior to arrival.  Will obtain CT of head and neck given her age and mechanism of  injury.  CT is negative for acute findings.  Results discussed with patient and her daughter, they feel reassured.  Daughter requested that she receive her nighttime medications here as she has missed med rounds for the night, these were ordered and given.  PTAR to transport back to facility.  Patient seen and evaluated with attending physician, Dr. Vanita Panda, who agrees with assessment and plan of care.  Final Clinical Impressions(s) / ED Diagnoses   Final diagnoses:  Fall, initial encounter  Injury of head, initial encounter    ED Discharge Orders    None       Larene Pickett, PA-C 01/31/18 0211    Carmin Muskrat, MD 02/02/18 [phone]

## 2018-01-30 NOTE — ED Triage Notes (Signed)
Pt is presented from [facility] for evaluation post a witnessed fall from a sitting position. Obvious slight head swelling on the right side of frontal aspect, MAR indicates no blood thinners, pt and EMS denies LOC.

## 2018-01-31 DIAGNOSIS — R51 Headache: Secondary | ICD-10-CM | POA: Diagnosis not present

## 2018-01-31 MED ORDER — CITALOPRAM HYDROBROMIDE 10 MG PO TABS
5.0000 mg | ORAL_TABLET | Freq: Once | ORAL | Status: AC
  Administered 2018-01-31: 5 mg via ORAL
  Filled 2018-01-31: qty 1

## 2018-01-31 MED ORDER — SIMVASTATIN 10 MG PO TABS
10.0000 mg | ORAL_TABLET | Freq: Once | ORAL | Status: AC
  Administered 2018-01-31: 10 mg via ORAL
  Filled 2018-01-31: qty 1

## 2018-01-31 MED ORDER — PRIMIDONE 250 MG PO TABS
250.0000 mg | ORAL_TABLET | Freq: Once | ORAL | Status: AC
  Administered 2018-01-31: 250 mg via ORAL
  Filled 2018-01-31: qty 1

## 2018-01-31 NOTE — Discharge Instructions (Signed)
Imaging today looked great. Recommend tylenol or motrin for headache. Follow-up with your primary care doctor. Return here for any new/acute changes.

## 2018-02-02 ENCOUNTER — Emergency Department
Admission: EM | Admit: 2018-02-02 | Discharge: 2018-02-03 | Disposition: A | Discharge status: Home / Self Care | Payer: Medicare Other | Attending: Emergency Medicine | Admitting: Emergency Medicine

## 2018-02-02 ENCOUNTER — Encounter: Payer: Self-pay | Admitting: Emergency Medicine

## 2018-02-02 ENCOUNTER — Other Ambulatory Visit: Payer: Self-pay

## 2018-02-02 ENCOUNTER — Emergency Department: Payer: Medicare Other

## 2018-02-02 DIAGNOSIS — Z5321 Procedure and treatment not carried out due to patient leaving prior to being seen by health care provider: Secondary | ICD-10-CM | POA: Diagnosis not present

## 2018-02-02 DIAGNOSIS — R51 Headache: Secondary | ICD-10-CM | POA: Insufficient documentation

## 2018-02-02 DIAGNOSIS — R110 Nausea: Secondary | ICD-10-CM | POA: Insufficient documentation

## 2018-02-02 DIAGNOSIS — Z043 Encounter for examination and observation following other accident: Secondary | ICD-10-CM | POA: Insufficient documentation

## 2018-02-02 LAB — CBC
HCT: 44.1 % (ref 35.0–47.0)
HEMOGLOBIN: 15.2 g/dL (ref 12.0–16.0)
MCH: 33.6 pg (ref 26.0–34.0)
MCHC: 34.4 g/dL (ref 32.0–36.0)
MCV: 97.7 fL (ref 80.0–100.0)
Platelets: 269 10*3/uL (ref 150–440)
RBC: 4.52 MIL/uL (ref 3.80–5.20)
RDW: 13.7 % (ref 11.5–14.5)
WBC: 5.3 10*3/uL (ref 3.6–11.0)

## 2018-02-02 LAB — COMPREHENSIVE METABOLIC PANEL
ALBUMIN: 3.8 g/dL (ref 3.5–5.0)
ALT: 22 U/L (ref 0–44)
AST: 25 U/L (ref 15–41)
Alkaline Phosphatase: 103 U/L (ref 38–126)
Anion gap: 9 (ref 5–15)
BILIRUBIN TOTAL: 0.5 mg/dL (ref 0.3–1.2)
BUN: 12 mg/dL (ref 8–23)
CO2: 33 mmol/L — ABNORMAL HIGH (ref 22–32)
Calcium: 9.2 mg/dL (ref 8.9–10.3)
Chloride: 98 mmol/L (ref 98–111)
Creatinine, Ser: 0.81 mg/dL (ref 0.44–1.00)
GFR calc Af Amer: >60 mL/min (ref >60)
Glucose, Bld: 109 mg/dL — ABNORMAL HIGH (ref 70–99)
POTASSIUM: 4.8 mmol/L (ref 3.5–5.1)
Sodium: 140 mmol/L (ref 135–145)
TOTAL PROTEIN: 7.1 g/dL (ref 6.5–8.1)

## 2018-02-02 NOTE — ED Provider Notes (Signed)
According charge nurse patient was bed in the hallway and then left prior to being evaluated because patient was upset that she was placed in a hallway bed.   Orbie Pyo, MD 02/02/18 [phone]

## 2018-02-02 NOTE — ED Triage Notes (Signed)
Pt arrived via POV with daughter with reports of witnessed fall on Friday night while residing at the [facility] in [place].  Per daughter, pt had multiple falls there and fell on Friday night hitting her head and cutting her leg.  Daughter reports bruising all over from the fall.  Daughter states the patient was residing at the [facility] only for respite care while the daughter was going to go out of town.   Daughter reports pt has had increased sleepiness since the fall and has had a decreased appetite as well as nausea.  Pt was given Tylenol around 11am today. Pt is alert in w/c had abrasion to forehead. Pt reports her headache pain is 3-4/10.   Pt has not had any new injuries since the fall, daughter is worried about the continued headache and nausea.

## 2018-02-06 ENCOUNTER — Telehealth: Payer: Self-pay | Admitting: *Deleted

## 2018-02-06 NOTE — Telephone Encounter (Signed)
rec'd a call from pt's daughter very distraught and concerned, triage informed her that this is [facility] and could not release info on pt's who are not part of [facility], she explained trips to ED and she states her mother has appt mon w/ pcp but [facility] is present and she was concerned about possibility of abnormal labs and the wait til mon. She was not given information but was reassured that pcp would be able to obtain results and there was no urgency r/t labs, reassured her and wished her well.

## 2018-05-26 ENCOUNTER — Encounter (HOSPITAL_COMMUNITY): Payer: Self-pay

## 2018-05-26 ENCOUNTER — Other Ambulatory Visit: Payer: Self-pay

## 2018-05-26 ENCOUNTER — Inpatient Hospital Stay (HOSPITAL_COMMUNITY)
Admission: EM | Admit: 2018-05-26 | Discharge: 2018-05-29 | DRG: 689 | Disposition: A | Discharge status: Skilled Nursing Facility | Payer: Medicare Other | Attending: Internal Medicine | Admitting: Internal Medicine

## 2018-05-26 DIAGNOSIS — Z961 Presence of intraocular lens: Secondary | ICD-10-CM | POA: Diagnosis present

## 2018-05-26 DIAGNOSIS — Z9842 Cataract extraction status, left eye: Secondary | ICD-10-CM | POA: Diagnosis not present

## 2018-05-26 DIAGNOSIS — K529 Noninfective gastroenteritis and colitis, unspecified: Secondary | ICD-10-CM

## 2018-05-26 DIAGNOSIS — Z923 Personal history of irradiation: Secondary | ICD-10-CM

## 2018-05-26 DIAGNOSIS — M21961 Unspecified acquired deformity of right lower leg: Secondary | ICD-10-CM | POA: Diagnosis present

## 2018-05-26 DIAGNOSIS — F0390 Unspecified dementia without behavioral disturbance: Secondary | ICD-10-CM

## 2018-05-26 DIAGNOSIS — Z85038 Personal history of other malignant neoplasm of large intestine: Secondary | ICD-10-CM | POA: Diagnosis not present

## 2018-05-26 DIAGNOSIS — Z1629 Resistance to other single specified antibiotic: Secondary | ICD-10-CM | POA: Diagnosis present

## 2018-05-26 DIAGNOSIS — F419 Anxiety disorder, unspecified: Secondary | ICD-10-CM | POA: Diagnosis present

## 2018-05-26 DIAGNOSIS — Z87440 Personal history of urinary (tract) infections: Secondary | ICD-10-CM

## 2018-05-26 DIAGNOSIS — K5641 Fecal impaction: Secondary | ICD-10-CM | POA: Diagnosis present

## 2018-05-26 DIAGNOSIS — Z8679 Personal history of other diseases of the circulatory system: Secondary | ICD-10-CM

## 2018-05-26 DIAGNOSIS — E785 Hyperlipidemia, unspecified: Secondary | ICD-10-CM | POA: Diagnosis present

## 2018-05-26 DIAGNOSIS — I10 Essential (primary) hypertension: Secondary | ICD-10-CM | POA: Diagnosis present

## 2018-05-26 DIAGNOSIS — Z885 Allergy status to narcotic agent status: Secondary | ICD-10-CM

## 2018-05-26 DIAGNOSIS — N390 Urinary tract infection, site not specified: Secondary | ICD-10-CM | POA: Diagnosis present

## 2018-05-26 DIAGNOSIS — Z79899 Other long term (current) drug therapy: Secondary | ICD-10-CM | POA: Diagnosis not present

## 2018-05-26 DIAGNOSIS — Z7982 Long term (current) use of aspirin: Secondary | ICD-10-CM

## 2018-05-26 DIAGNOSIS — F329 Major depressive disorder, single episode, unspecified: Secondary | ICD-10-CM | POA: Diagnosis present

## 2018-05-26 DIAGNOSIS — R251 Tremor, unspecified: Secondary | ICD-10-CM | POA: Diagnosis present

## 2018-05-26 DIAGNOSIS — Z66 Do not resuscitate: Secondary | ICD-10-CM | POA: Diagnosis present

## 2018-05-26 DIAGNOSIS — E7800 Pure hypercholesterolemia, unspecified: Secondary | ICD-10-CM | POA: Diagnosis present

## 2018-05-26 DIAGNOSIS — Z85828 Personal history of other malignant neoplasm of skin: Secondary | ICD-10-CM | POA: Diagnosis not present

## 2018-05-26 DIAGNOSIS — Z9841 Cataract extraction status, right eye: Secondary | ICD-10-CM | POA: Diagnosis not present

## 2018-05-26 DIAGNOSIS — Z789 Other specified health status: Secondary | ICD-10-CM | POA: Diagnosis present

## 2018-05-26 DIAGNOSIS — K5649 Other impaction of intestine: Secondary | ICD-10-CM

## 2018-05-26 DIAGNOSIS — N3000 Acute cystitis without hematuria: Secondary | ICD-10-CM

## 2018-05-26 DIAGNOSIS — G9341 Metabolic encephalopathy: Secondary | ICD-10-CM | POA: Diagnosis present

## 2018-05-26 DIAGNOSIS — M1990 Unspecified osteoarthritis, unspecified site: Secondary | ICD-10-CM | POA: Diagnosis present

## 2018-05-26 LAB — URINALYSIS, ROUTINE W REFLEX MICROSCOPIC
BILIRUBIN URINE: NEGATIVE
Glucose, UA: NEGATIVE mg/dL
Hgb urine dipstick: NEGATIVE
Ketones, ur: NEGATIVE mg/dL
NITRITE: NEGATIVE
PH: 7 (ref 5.0–8.0)
Protein, ur: NEGATIVE mg/dL
SPECIFIC GRAVITY, URINE: 1.006 (ref 1.005–1.030)

## 2018-05-26 LAB — BASIC METABOLIC PANEL
Anion gap: 7 (ref 5–15)
BUN: 11 mg/dL (ref 8–23)
CHLORIDE: 97 mmol/L — AB (ref 98–111)
CO2: 32 mmol/L (ref 22–32)
CREATININE: 0.68 mg/dL (ref 0.44–1.00)
Calcium: 8.7 mg/dL — ABNORMAL LOW (ref 8.9–10.3)
GFR calc non Af Amer: >60 mL/min (ref >60)
Glucose, Bld: 100 mg/dL — ABNORMAL HIGH (ref 70–99)
POTASSIUM: 4 mmol/L (ref 3.5–5.1)
Sodium: 136 mmol/L (ref 135–145)

## 2018-05-26 LAB — CBC WITH DIFFERENTIAL/PLATELET
ABS IMMATURE GRANULOCYTES: 0.01 10*3/uL (ref 0.00–0.07)
Basophils Absolute: 0 10*3/uL (ref 0.0–0.1)
Basophils Relative: 0 %
Eosinophils Absolute: 0.2 10*3/uL (ref 0.0–0.5)
Eosinophils Relative: 4 %
HEMATOCRIT: 44.6 % (ref 36.0–46.0)
Hemoglobin: 13.9 g/dL (ref 12.0–15.0)
Immature Granulocytes: 0 %
LYMPHS PCT: 39 %
Lymphs Abs: 2 10*3/uL (ref 0.7–4.0)
MCH: 32 pg (ref 26.0–34.0)
MCHC: 31.2 g/dL (ref 30.0–36.0)
MCV: 102.5 fL — AB (ref 80.0–100.0)
MONO ABS: 0.7 10*3/uL (ref 0.1–1.0)
MONOS PCT: 13 %
NEUTROS ABS: 2.3 10*3/uL (ref 1.7–7.7)
NRBC: 0 % (ref 0.0–0.2)
Neutrophils Relative %: 44 %
Platelets: 207 10*3/uL (ref 150–400)
RBC: 4.35 MIL/uL (ref 3.87–5.11)
RDW: 13 % (ref 11.5–15.5)
WBC: 5.2 10*3/uL (ref 4.0–10.5)

## 2018-05-26 MED ORDER — VANCOMYCIN 50 MG/ML ORAL SOLUTION
125.0000 mg | Freq: Four times a day (QID) | ORAL | Status: DC
  Administered 2018-05-27: 125 mg via ORAL
  Filled 2018-05-26 (×2): qty 2.5

## 2018-05-26 MED ORDER — BISMUTH SUBSALICYLATE 262 MG PO CHEW
262.0000 mg | CHEWABLE_TABLET | ORAL | Status: DC | PRN
  Filled 2018-05-26: qty 1

## 2018-05-26 MED ORDER — PRIMIDONE 250 MG PO TABS
250.0000 mg | ORAL_TABLET | Freq: Two times a day (BID) | ORAL | Status: DC
  Administered 2018-05-27 – 2018-05-29 (×6): 250 mg via ORAL
  Filled 2018-05-26 (×6): qty 1

## 2018-05-26 MED ORDER — SODIUM CHLORIDE 0.9 % IV BOLUS
500.0000 mL | Freq: Once | INTRAVENOUS | Status: AC
  Administered 2018-05-26: 500 mL via INTRAVENOUS

## 2018-05-26 MED ORDER — SODIUM CHLORIDE 0.9 % IV SOLN
INTRAVENOUS | Status: DC
  Administered 2018-05-27 – 2018-05-28 (×3): via INTRAVENOUS

## 2018-05-26 MED ORDER — SODIUM CHLORIDE 0.9 % IV SOLN
1.0000 g | Freq: Every day | INTRAVENOUS | Status: DC
  Administered 2018-05-27 – 2018-05-28 (×2): 1 g via INTRAVENOUS
  Filled 2018-05-26 (×2): qty 1

## 2018-05-26 MED ORDER — SIMVASTATIN 10 MG PO TABS
10.0000 mg | ORAL_TABLET | Freq: Every day | ORAL | Status: DC
  Administered 2018-05-27 – 2018-05-28 (×3): 10 mg via ORAL
  Filled 2018-05-26 (×3): qty 1

## 2018-05-26 MED ORDER — CITALOPRAM HYDROBROMIDE 20 MG PO TABS
10.0000 mg | ORAL_TABLET | Freq: Two times a day (BID) | ORAL | Status: DC
  Administered 2018-05-27 – 2018-05-29 (×6): 10 mg via ORAL
  Filled 2018-05-26 (×6): qty 1

## 2018-05-26 MED ORDER — CIPROFLOXACIN IN D5W 400 MG/200ML IV SOLN
400.0000 mg | Freq: Once | INTRAVENOUS | Status: AC
  Administered 2018-05-26: 400 mg via INTRAVENOUS
  Filled 2018-05-26: qty 200

## 2018-05-26 MED ORDER — ASPIRIN EC 81 MG PO TBEC
81.0000 mg | DELAYED_RELEASE_TABLET | Freq: Every day | ORAL | Status: DC
  Administered 2018-05-27 – 2018-05-29 (×3): 81 mg via ORAL
  Filled 2018-05-26 (×3): qty 1

## 2018-05-26 MED ORDER — DIGOXIN 125 MCG PO TABS
125.0000 ug | ORAL_TABLET | Freq: Every day | ORAL | Status: DC
  Administered 2018-05-27 – 2018-05-29 (×3): 125 ug via ORAL
  Filled 2018-05-26 (×3): qty 1

## 2018-05-26 MED ORDER — ACETAMINOPHEN 325 MG PO TABS: 650.0000 mg | ORAL_TABLET | Freq: Four times a day (QID) | ORAL | Status: DC | PRN

## 2018-05-26 MED ORDER — MEMANTINE HCL 5 MG PO TABS
5.0000 mg | ORAL_TABLET | Freq: Four times a day (QID) | ORAL | Status: DC
  Administered 2018-05-27 – 2018-05-29 (×10): 5 mg via ORAL
  Filled 2018-05-26 (×13): qty 1

## 2018-05-26 MED ORDER — ACETAMINOPHEN 650 MG RE SUPP: 650.0000 mg | Freq: Four times a day (QID) | RECTAL | Status: DC | PRN

## 2018-05-26 NOTE — H&P (Signed)
PCP:   Valera Castle, MD   Code Status: DNR  Chief Complaint:    HPI: This is a 85 year old female with history of dementia.  She presents with urinary symptoms complaints. She has a h/o recurrent UTI that was finally resolved for years. Around christmas she had a bad UTI.  She was prescribed nitrofurantoin for 7 days. She was ill again 3 days after completion of the regimen.  A second course of nitrofurantoin was started by PCP. Her UA then came back sensitive to cipro and resistant to nitrofurantoin.  Cipro was substituted.  Her daughter the patient stopped urinating yesterday.  The daughter also reports increased confusion. She has not had any fevers or chills. Her urine is foul smelling. Her daughter brought her to the ER.  The patient has also been having diarrhea 2-3 times daily for the last week. It is almost black in color. The patent is on daily baby ASA. She is on no NSAID. There is a h/o of GERD. She was on a PPI and it was discontinued by PCP because it was affecting her memory. The patient does not c/o stomach discomfort   All history provided by the patients daughter who is present at bedside. Secundino Ginger [phone] (H). POA  Review of Systems:  The patient denies anorexia, fever, weight loss, confusion, diarrhea, vision loss, decreased hearing, hoarseness, chest pain, syncope, dyspnea on exertion, peripheral edema, balance deficits, hemoptysis, abdominal pain, melena, hematochezia, severe indigestion/heartburn, hematuria, incontinence, genital sores, muscle weakness, suspicious skin lesions, transient blindness, difficulty walking, depression, unusual weight change, abnormal bleeding, enlarged lymph nodes, angioedema, and breast masses.  Past Medical History: Past Medical History:  Diagnosis Date  . Anxiety   . Anxiety and depression   . Arthritis   . Cancer of skin of neck 1983   "right side"  . CHF (congestive heart failure) ([place])   . Colon cancer ([place]) ~ 2015  .  Dementia ([place])   . Depression   . Fall at home ?12/05/2015   minimally displaced tibial tuberosity fracture right knee  . High cholesterol   . Irregular heart beats   . Meningitis 1926   caused right foot deformity   . Occasional tremors    Past Surgical History:  Procedure Laterality Date  . CATARACT EXTRACTION W/ INTRAOCULAR LENS  IMPLANT, BILATERAL Bilateral   . COLON SURGERY  2016   "had part of it taken out; in [place]"  . FRACTURE SURGERY    . HIP FRACTURE SURGERY Right   . SKIN CANCER EXCISION  1983   "took radiation shots too"    Medications: Prior to Admission medications   Medication Sig Start Date End Date Taking? Authorizing Provider  acetaminophen (TYLENOL) 500 MG tablet Take 500 mg by mouth every 6 (six) hours as needed for moderate pain.   Yes [provider]  Ascorbic Acid (VITAMIN C) 1000 MG tablet Take 1 tablet by mouth daily.   Yes [provider]  aspirin 81 MG tablet Take 1 tablet by mouth daily. 02/09/08  Yes [provider]  bismuth subsalicylate (PEPTO BISMOL) 262 MG chewable tablet Chew 262 mg by mouth as needed for indigestion.   Yes [provider]  Cholecalciferol (VITAMIN D PO) Take 1 tablet by mouth daily.   Yes [provider]  citalopram (CELEXA) 20 MG tablet Take 10 mg by mouth 2 (two) times daily.  05/12/18  Yes [provider]  clotrimazole (LOTRIMIN) 1 % cream Apply 1 application topically as  directed. 03/19/17  Yes [provider]  digoxin (LANOXIN) 0.125 MG tablet Take 1 tablet by mouth daily. 03/25/12  Yes [provider]  Ibuprofen-diphenhydrAMINE HCl (ADVIL PM) 200-25 MG CAPS Take 2 capsules by mouth at bedtime.   Yes [provider]  loperamide (IMODIUM A-D) 2 MG tablet Take 2 mg by mouth 4 (four) times daily as needed for diarrhea or loose stools.   Yes [provider]  memantine (NAMENDA) 5 MG tablet Take 5 mg by mouth 4 (four) times daily. 03/17/17   Yes [provider]  Multiple Vitamins-Minerals (CENTRUM SILVER ADULT 50+) TABS Take 1 tablet by mouth daily.   Yes [provider]  primidone (MYSOLINE) 250 MG tablet Take 1 tablet by mouth 2 (two) times daily. 02/08/13  Yes [provider]  simvastatin (ZOCOR) 10 MG tablet Take 1 tablet by mouth at bedtime. 03/25/12  Yes [provider]  Tetrahydrozoline HCl (VISINE OP) Apply 1 drop to eye daily as needed (dry eyes).   Yes [provider]  ondansetron (ZOFRAN) 4 MG tablet Take 1 tablet (4 mg total) by mouth every 8 (eight) hours as needed for nausea or vomiting. Patient not taking: Reported on 05/26/2018 12/05/15   Mackuen, Fredia Sorrow, MD  oxyCODONE-acetaminophen (PERCOCET/ROXICET) 5-325 MG tablet Take 1 tablet by mouth every 6 (six) hours as needed for severe pain. Patient not taking: Reported on 05/26/2018 12/05/15   Macarthur Critchley, MD    Allergies:   Allergies  Allergen Reactions  . Codeine Nausea And Vomiting  . Morphine Other (See Comments)    Hallucinations    Social History:  reports that she has never smoked. She has never used smokeless tobacco. She reports that she does not drink alcohol or use drugs.  Family History: History reviewed. No pertinent family history.  Physical Exam: Vitals:   05/26/18 1811 05/26/18 1900 05/26/18 2000 05/26/18 2100  BP:  (!) 149/69 (!) 158/77 (!) 153/70  Pulse:  66 67 68  Resp:  16 18 17   Temp:      TempSrc:      SpO2:  98% 98% 97%  Weight: 45.6 kg     Height: 4\' 11"  (1.499 m)       General:  Alert and oriented times three, frail female, no acute distress Eyes: PERRLA, pink conjunctiva, no scleral icterus ENT: Moist oral mucosa, neck supple, no thyromegaly Lungs: clear to ascultation, no wheeze, no crackles, no use of accessory muscles Cardiovascular: regular rate and rhythm, no regurgitation, no gallops, no murmurs. No carotid bruits, no JVD Abdomen: soft, positive BS, non-tender,  non-distended, no organomegaly, not an acute abdomen GU: not examined Neuro: CN II - XII grossly intact, sensation intact Musculoskeletal: strength 5/5 all extremities, no clubbing, cyanosis or edema Skin: no rash, no subcutaneous crepitation, no decubitus Psych: currently appropriate patient   Labs on Admission:  Recent Labs    05/26/18 1845  NA 136  K 4.0  CL 97*  CO2 32  GLUCOSE 100*  BUN 11  CREATININE 0.68  CALCIUM 8.7*   No results for input(s): AST, ALT, ALKPHOS, BILITOT, PROT, ALBUMIN in the last 72 hours. No results for input(s): LIPASE, AMYLASE in the last 72 hours. Recent Labs    05/26/18 1845  WBC 5.2  NEUTROABS 2.3  HGB 13.9  HCT 44.6  MCV 102.5*  PLT 207   No results for input(s): CKTOTAL, CKMB, CKMBINDEX, TROPONINI in the last 72 hours. Invalid input(s): POCBNP No results for input(s): DDIMER  in the last 72 hours. No results for input(s): HGBA1C in the last 72 hours. No results for input(s): CHOL, HDL, LDLCALC, TRIG, CHOLHDL, LDLDIRECT in the last 72 hours. No results for input(s): TSH, T4TOTAL, T3FREE, THYROIDAB in the last 72 hours.  Invalid input(s): FREET3 No results for input(s): VITAMINB12, FOLATE, FERRITIN, TIBC, IRON, RETICCTPCT in the last 72 hours.  Micro Results: No results found for this or any previous visit (from the past 240 hour(s)).   Radiological Exams on Admission: No results found.  Assessment/Plan Present on Admission: . Acute lower UTI . Failure of outpatient treatment -Admit to MedSurg -Blood and urine cultures collected -IV Rocephin -Gentle IV fluid hydration  Diarrhea -GI biofire an C Diff ordered -occult stool ordered -SCD's only for GI prophylaxis for now  Dementia -Currently appears stable.  Per daughter the patient does sundown -We will resume home medication  Hypertension -Stable, home medications resumed  Dyslipidemia -Stable, home medications resumed  History of congestive heart  failure -Aware  History of colon cancer -In remission  Earland Reish 05/26/2018, 9:27 PM

## 2018-05-26 NOTE — Progress Notes (Signed)
MEDICATION RELATED CONSULT NOTE - INITIAL   Pharmacy Consult for C.Diff Management Indication: first occurrence  Allergies  Allergen Reactions  . Codeine Nausea And Vomiting  . Morphine Other (See Comments)    Hallucinations    Patient Measurements: Height: 4\' 11"  (149.9 cm) Weight: 100 lb 7 oz (45.6 kg) IBW/kg (Calculated) : 43.2  Vital Signs: Temp: 97.6 F (36.4 C) (01/21 1809) Temp Source: Oral (01/21 1809) BP: 153/70 (01/21 2134) Pulse Rate: 69 (01/21 2134) Intake/Output from previous day: No intake/output data recorded. Intake/Output from this shift: Total I/O In: 500 [IV Piggyback:500] Out: 350 [Urine:350]  Labs: Recent Labs    05/26/18 1845  WBC 5.2  HGB 13.9  HCT 44.6  PLT 207  CREATININE 0.68   Estimated Creatinine Clearance: 29.3 mL/min (by C-G formula based on SCr of 0.68 mg/dL).   Microbiology: No results found for this or any previous visit (from the past 720 hour(s)).  Medical History: Past Medical History:  Diagnosis Date  . Anxiety   . Anxiety and depression   . Arthritis   . Cancer of skin of neck 1983   "right side"  . CHF (congestive heart failure) ([place])   . Colon cancer ([place]) ~ 2015  . Dementia ([place])   . Depression   . Fall at home ?12/05/2015   minimally displaced tibial tuberosity fracture right knee  . High cholesterol   . Irregular heart beats   . Meningitis 1926   caused right foot deformity   . Occasional tremors     Assessment:  PMH significant for recurrent UTI and treated with reported several antibiotics over the past 3 weeks.  Noted diarrhea 2-3 times daily over the last week.  Pharmacy consulted to order medications for 1st occurrence of C Diff  Lab results pending  Goal of Therapy:  Eradication of infection  Plan:  Vancomycin 125mg  PO QID x 10 days F/U results of CDiff  Ozro Russett, Toribio Harbour, PharmD 05/26/2018,10:22 PM

## 2018-05-26 NOTE — ED Triage Notes (Signed)
Patient has not voided in the past 48 hours. Patient c/o dysuria.

## 2018-05-26 NOTE — ED Provider Notes (Signed)
[facility] DEPT Provider Note   CSN: [id number] Arrival date & time: 05/26/18  1749     History   Chief Complaint Chief Complaint  Patient presents with  . Urinary Retention  . Dysuria    HPI Latesha Gambrel is a 85 y.o. female.  Patient is a 85 year old female with past medical history of CHF, dementia, anxiety.  She presents today for evaluation of decreased urine output.  According to the daughter, she has not urinated in the past 1-1/2 days.  Patient denies any dysuria, abdominal pain, or fever.  According to the daughter at bedside, she has been treated with several antibiotics over the past 3 weeks for resistant UTIs.  She is now having what the daughter describes as "explosive diarrhea".  The history is provided by the patient.    Past Medical History:  Diagnosis Date  . Anxiety   . Anxiety and depression   . Arthritis   . Cancer of skin of neck 1983   "right side"  . CHF (congestive heart failure) ([place])   . Colon cancer ([place]) ~ 2015  . Dementia ([place])   . Depression   . Fall at home ?12/05/2015   minimally displaced tibial tuberosity fracture right knee  . High cholesterol   . Irregular heart beats   . Meningitis 1926   caused right foot deformity   . Occasional tremors     Patient Active Problem List   Diagnosis Date Noted  . Failure to thrive in adult 12/07/2015    Past Surgical History:  Procedure Laterality Date  . CATARACT EXTRACTION W/ INTRAOCULAR LENS  IMPLANT, BILATERAL Bilateral   . COLON SURGERY  2016   "had part of it taken out; in [place]"  . FRACTURE SURGERY    . HIP FRACTURE SURGERY Right   . SKIN CANCER EXCISION  1983   "took radiation shots too"     OB History   No obstetric history on file.      Home Medications    Prior to Admission medications   Medication Sig Start Date End Date Taking? Authorizing Provider  acetaminophen (TYLENOL) 500 MG tablet Take 500 mg by mouth every 6 (six) hours  as needed for moderate pain.    [provider]  Ascorbic Acid (VITAMIN C) 1000 MG tablet Take 1 tablet by mouth daily.    [provider]  aspirin 81 MG tablet Take 1 tablet by mouth daily. 02/09/08   [provider]  Cholecalciferol (VITAMIN D PO) Take 1 tablet by mouth daily.    [provider]  citalopram (CELEXA) 10 MG tablet Take 5-10 mg by mouth daily. 10mg -AM 5mg -PM 05/30/17   [provider]  clotrimazole (LOTRIMIN) 1 % cream Apply 1 application topically as directed. 03/19/17   [provider]  digoxin (LANOXIN) 0.125 MG tablet Take 1 tablet by mouth daily. 03/25/12   [provider]  Ibuprofen-diphenhydrAMINE HCl (ADVIL PM) 200-25 MG CAPS Take 2 capsules by mouth at bedtime.    [provider]  loperamide (IMODIUM A-D) 2 MG tablet Take 2 mg by mouth 4 (four) times daily as needed for diarrhea or loose stools.    [provider]  Melatonin 3 MG TABS Take 1 tablet by mouth at bedtime.    [provider]  memantine (NAMENDA) 5 MG tablet Take 5 mg by mouth 4 (four) times daily. 03/17/17   [provider]  Multiple Vitamins-Minerals (ADULT ONE DAILY GUMMIES PO) Take 1  tablet by mouth daily.    [provider]  Multiple Vitamins-Minerals (CENTRUM SILVER ADULT 50+) TABS Take 1 tablet by mouth daily.    [provider]  omeprazole (PRILOSEC) 20 MG capsule Take 20 mg by mouth daily.  01/02/18   [provider]  ondansetron (ZOFRAN) 4 MG tablet Take 1 tablet (4 mg total) by mouth every 8 (eight) hours as needed for nausea or vomiting. Patient not taking: Reported on 06/08/2017 12/05/15   Mackuen, Fredia Sorrow, MD  oxyCODONE-acetaminophen (PERCOCET/ROXICET) 5-325 MG tablet Take 1 tablet by mouth every 6 (six) hours as needed for severe pain. Patient not taking: Reported on 06/08/2017 12/05/15   Mackuen, Courteney Lyn, MD  primidone (MYSOLINE) 250 MG tablet Take 1 tablet by mouth 2  (two) times daily. 02/08/13   [provider]  simvastatin (ZOCOR) 10 MG tablet Take 1 tablet by mouth at bedtime. 03/25/12   [provider]    Family History History reviewed. No pertinent family history.  Social History Social History   Tobacco Use  . Smoking status: Never Smoker  . Smokeless tobacco: Never Used  Substance Use Topics  . Alcohol use: No  . Drug use: No     Allergies   Codeine and Morphine   Review of Systems Review of Systems  All other systems reviewed and are negative.    Physical Exam Updated Vital Signs BP (!) 147/87 (BP Location: Left Arm)   Pulse 77   Temp 97.6 F (36.4 C) (Oral)   Resp 18   Ht 4\' 11"  (1.499 m)   Wt 45.6 kg   SpO2 100%   BMI 20.29 kg/m   Physical Exam Vitals signs and nursing note reviewed.  Constitutional:      General: She is not in acute distress.    Appearance: She is well-developed. She is not diaphoretic.  HENT:     Head: Normocephalic and atraumatic.  Neck:     Musculoskeletal: Normal range of motion and neck supple.  Cardiovascular:     Rate and Rhythm: Normal rate and regular rhythm.     Heart sounds: No murmur. No friction rub. No gallop.   Pulmonary:     Effort: Pulmonary effort is normal. No respiratory distress.     Breath sounds: Normal breath sounds. No wheezing.  Abdominal:     General: Bowel sounds are normal. There is no distension.     Palpations: Abdomen is soft.     Tenderness: There is no abdominal tenderness.  Musculoskeletal: Normal range of motion.  Skin:    General: Skin is warm and dry.  Neurological:     Mental Status: She is alert and oriented to person, place, and time.      ED Treatments / Results  Labs (all labs ordered are listed, but only abnormal results are displayed) Labs Reviewed  URINE CULTURE  URINALYSIS, ROUTINE W REFLEX MICROSCOPIC  BASIC METABOLIC PANEL  CBC WITH DIFFERENTIAL/PLATELET    EKG None  Radiology No results  found.  Procedures Procedures (including critical care time)  Medications Ordered in ED Medications  sodium chloride 0.9 % bolus 500 mL (has no administration in time range)     Initial Impression / Assessment and Plan / ED Course  I have reviewed the triage vital signs and the nursing notes.  Pertinent labs & imaging results that were available during my care of the patient were reviewed by me and considered in my medical decision making (see chart for details).  Patient  with persistent UTI despite 3 oral antibiotics.  She is now having diarrhea as well.  Foley catheter was placed with 350 cc of urine returned as the patient has been unable to void since yesterday.  Patient will be admitted to the hospitalist service for IV antibiotics.  Dr. Claria Dice agrees to admit.  Final Clinical Impressions(s) / ED Diagnoses   Final diagnoses:  None    ED Discharge Orders    None       Veryl Speak, MD 05/26/18 2208

## 2018-05-27 ENCOUNTER — Inpatient Hospital Stay (HOSPITAL_COMMUNITY): Payer: Medicare Other

## 2018-05-27 DIAGNOSIS — Z85038 Personal history of other malignant neoplasm of large intestine: Secondary | ICD-10-CM

## 2018-05-27 LAB — CBC
HCT: 41.9 % (ref 36.0–46.0)
Hemoglobin: 13.2 g/dL (ref 12.0–15.0)
MCH: 32.4 pg (ref 26.0–34.0)
MCHC: 31.5 g/dL (ref 30.0–36.0)
MCV: 102.7 fL — ABNORMAL HIGH (ref 80.0–100.0)
Platelets: 197 10*3/uL (ref 150–400)
RBC: 4.08 MIL/uL (ref 3.87–5.11)
RDW: 13 % (ref 11.5–15.5)
WBC: 5.2 10*3/uL (ref 4.0–10.5)
nRBC: 0 % (ref 0.0–0.2)

## 2018-05-27 LAB — BASIC METABOLIC PANEL
Anion gap: 8 (ref 5–15)
BUN: 8 mg/dL (ref 8–23)
CO2: 30 mmol/L (ref 22–32)
Calcium: 8.8 mg/dL — ABNORMAL LOW (ref 8.9–10.3)
Chloride: 104 mmol/L (ref 98–111)
Creatinine, Ser: 0.67 mg/dL (ref 0.44–1.00)
GFR calc Af Amer: >60 mL/min (ref >60)
GFR calc non Af Amer: >60 mL/min (ref >60)
Glucose, Bld: 96 mg/dL (ref 70–99)
Potassium: 4.5 mmol/L (ref 3.5–5.1)
Sodium: 142 mmol/L (ref 135–145)

## 2018-05-27 NOTE — Progress Notes (Addendum)
Progress Note    Kayla Pace  ZDG:[id number] DOB: 1921-12-21  DOA: 05/26/2018 PCP: Valera Castle, MD    Brief Narrative:    Medical records reviewed and are as summarized below:  Kayla Pace is an 85 y.o. female with history of dementia.  She presents with urinary symptoms complaints. She has a h/o recurrent UTI that was finally resolved for years. Around christmas she had a bad UTI.  She was prescribed nitrofurantoin for 7 days. She was ill again 3 days after completion of the regimen.  A second course of nitrofurantoin was started by PCP. Her UA then came back sensitive to cipro and resistant to nitrofurantoin.  Cipro was substituted.  Her daughter the patient stopped urinating yesterday.  The daughter also reports increased confusion. She has not had any fevers or chills. Her urine is foul smelling.  Assessment/Plan:   Principal Problem:   Acute lower UTI Active Problems:   Failure of outpatient treatment   Anxiety and depression   Dementia without behavioral disturbance (HCC)   Dyslipidemia   H/O colon cancer, stage I   H/O CHF   UTI (urinary tract infection)  Acute lower UTI/? Failure of outpatient treatment (growing klebsiella/enterococcus:  -Blood and urine cultures collected -IV Rocephin -Gentle IV fluid hydration  Diarrhea -? Diarrhea around stool -xray shows increased burden  Dementia -resume home meds -per daughter seems to be at her baseline- confused to person  Hypertension -resume home meds  Acute urinary retention -voiding trial in AM  Dyslipidemia -Stable, home medications resumed  History of congestive heart failure -Aware  History of colon cancer -In remission   Family Communication/Anticipated D/C date and plan/Code Status   DVT prophylaxis: scd Code Status: Full Code.  Family Communication: called daughter Disposition Plan: PT eval-- may need SNF     Subjective:   Says her daughter is an  imposter  Objective:    Vitals:   05/26/18 2300 05/27/18 0000 05/27/18 0311 05/27/18 0552  BP: (!) 136/93 (!) 181/83 (!) 151/70 (!) 164/79  Pulse: 68 64 61 67  Resp: 17 18  16   Temp:  98 F (36.7 C)  (!) 97.5 F (36.4 C)  TempSrc:  Oral  Oral  SpO2: 95% 100%  96%  Weight:      Height:        Intake/Output Summary (Last 24 hours) at 05/27/2018 1320 Last data filed at 05/27/2018 0600 Gross per 24 hour  Intake 1026.04 ml  Output 1450 ml  Net -423.96 ml   Filed Weights   05/26/18 1811  Weight: 45.6 kg    Exam: In bed, pleasant rrr +BS, firm No LE edema Oriented to person but not place or time  Data Reviewed:   I have personally reviewed following labs and imaging studies:  Labs: Labs show the following:   Basic Metabolic Panel: Recent Labs  Lab 05/26/18 1845 05/27/18 0434  NA 136 142  K 4.0 4.5  CL 97* 104  CO2 32 30  GLUCOSE 100* 96  BUN 11 8  CREATININE 0.68 0.67  CALCIUM 8.7* 8.8*   GFR Estimated Creatinine Clearance: 29.3 mL/min (by C-G formula based on SCr of 0.67 mg/dL). Liver Function Tests: No results for input(s): AST, ALT, ALKPHOS, BILITOT, PROT, ALBUMIN in the last 168 hours. No results for input(s): LIPASE, AMYLASE in the last 168 hours. No results for input(s): AMMONIA in the last 168 hours. Coagulation profile No results for input(s): INR, PROTIME in the last 168 hours.  CBC: Recent Labs  Lab 05/26/18 1845 05/27/18 0434  WBC 5.2 5.2  NEUTROABS 2.3  --   HGB 13.9 13.2  HCT 44.6 41.9  MCV 102.5* 102.7*  PLT 207 197   Cardiac Enzymes: No results for input(s): CKTOTAL, CKMB, CKMBINDEX, TROPONINI in the last 168 hours. BNP (last 3 results) No results for input(s): PROBNP in the last 8760 hours. CBG: No results for input(s): GLUCAP in the last 168 hours. D-Dimer: No results for input(s): DDIMER in the last 72 hours. Hgb A1c: No results for input(s): HGBA1C in the last 72 hours. Lipid Profile: No results for input(s): CHOL,  HDL, LDLCALC, TRIG, CHOLHDL, LDLDIRECT in the last 72 hours. Thyroid function studies: No results for input(s): TSH, T4TOTAL, T3FREE, THYROIDAB in the last 72 hours.  Invalid input(s): FREET3 Anemia work up: No results for input(s): VITAMINB12, FOLATE, FERRITIN, TIBC, IRON, RETICCTPCT in the last 72 hours. Sepsis Labs: Recent Labs  Lab 05/26/18 1845 05/27/18 0434  WBC 5.2 5.2    Microbiology No results found for this or any previous visit (from the past 240 hour(s)).  Procedures and diagnostic studies:  Dg Abd 1 View  Result Date: 05/27/2018 CLINICAL DATA:  Stool impaction. EXAM: ABDOMEN - 1 VIEW COMPARISON:  07/04/2017 FINDINGS: The bowel gas pattern is normal. A moderate stool burden is identified within the colon. No radio-opaque calculi or other significant radiographic abnormality are seen. IMPRESSION: 1. Nonobstructive bowel gas pattern. 2. Moderate stool burden noted within colon. Electronically Signed   By: Kerby Moors M.D.   On: 05/27/2018 12:12    Medications:   . aspirin EC  81 mg Oral Daily  . citalopram  10 mg Oral BID  . digoxin  125 mcg Oral Daily  . memantine  5 mg Oral QID  . primidone  250 mg Oral BID  . simvastatin  10 mg Oral QHS   Continuous Infusions: . sodium chloride 50 mL/hr at 05/27/18 0600  . cefTRIAXone (ROCEPHIN)  IV Stopped (05/27/18 0134)     LOS: 1 day   Geradine Girt  [facility]   *Please refer to [email or web address], password [email or web address] to get updated schedule on who will round on this patient, as hospitalists switch teams weekly. If 7PM-7AM, please contact night-coverage at [email or web address], password [email or web address] for any overnight needs.  05/27/2018, 1:20 PM

## 2018-05-27 NOTE — Evaluation (Addendum)
Physical Therapy Evaluation Patient Details Name: Kayla Pace MRN: [id number] DOB: 1922/03/07 Today's Date: 05/27/2018   History of Present Illness  85 y.o. female with PMH of CHF, colon cancer, dementia, R tibia fracture admitted with UTI and increased confusion.  Clinical Impression  Pt admitted with above diagnosis. Pt currently with functional limitations due to the deficits listed below (see PT Problem List). +2 max assist for supine to sit and for stand pivot transfer to recliner with RW. Pt has posterior lean in sitting and standing. Pt oriented to self and location, initially stated year is 2002 then corrected herself and stated it is 2020.  Pt will benefit from skilled PT to increase their independence and safety with mobility to allow discharge to the venue listed below.    Addendum: pt's daughter arrived just after PT eval was done. Daughter reports pt did walk short distances at home with a RW and hands on assist. She reported pt had 4 falls in September while pt had a short stay in a SNF.  Daughter is agreeable to ST-SNF.      Follow Up Recommendations SNF    Equipment Recommendations  None recommended by PT    Recommendations for Other Services       Precautions / Restrictions Precautions Precautions: Fall Precaution Comments: pt denies h/o falls, but is unreliable historian 2* dementia, no family present Restrictions Weight Bearing Restrictions: No      Mobility  Bed Mobility Overal bed mobility: Needs Assistance Bed Mobility: Supine to Sit     Supine to sit: +2 for physical assistance;Max assist     General bed mobility comments: assist to raise trunk and advance BLEs  Transfers Overall transfer level: Needs assistance Equipment used: Rolling walker (2 wheeled) Transfers: Sit to/from [facility] Sit to Stand: Max assist;+2 safety/equipment Stand pivot transfers: Max assist;+2 safety/equipment       General transfer comment: assist to  rise/steady, posterior lean throughout transfer, pt able to take a few pivotal steps with RW  Ambulation/Gait                Stairs            Wheelchair Mobility    Modified Rankin (Stroke Patients Only)       Balance Overall balance assessment: Needs assistance Sitting-balance support: Bilateral upper extremity supported;Feet supported Sitting balance-Leahy Scale: Poor Sitting balance - Comments: mod A for posterior lean Postural control: Posterior lean Standing balance support: Bilateral upper extremity supported Standing balance-Leahy Scale: Zero                               Pertinent Vitals/Pain Pain Assessment: No/denies pain   BP 163/83, HR 69, SaO2 100% on room air at rest    [place] expects to be discharged to:: Private residence Living Arrangements: Children               Additional Comments: no family present to provide this info, pt stated she lives with daughter and has assist for ADLs    Prior Function Level of Independence: Needs assistance   Gait / Transfers Assistance Needed: uses RW (per prior PT note in chart)  ADL's / Homemaking Assistance Needed: daughter assists with bathing/dressing        Hand Dominance        Extremity/Trunk Assessment   Upper Extremity Assessment Upper Extremity Assessment: Generalized weakness    Lower Extremity Assessment Lower  Extremity Assessment: Generalized weakness(BLEs internally rotated, pt stated this is 2* meningitis as an infant)    Cervical / Trunk Assessment Cervical / Trunk Assessment: Kyphotic  Communication   Communication: No difficulties  Cognition Arousal/Alertness: Awake/alert Behavior During Therapy: Anxious Overall Cognitive Status: No family/caregiver present to determine baseline cognitive functioning                                 General Comments: oriented to self, after a couple tries was able to correctly state  birthdate, knows she's in hospital in [place], stated date is "December 2002", then said "2020", pt became tearful due to worries about her social security check      General Comments      Exercises     Assessment/Plan    PT Assessment Patient needs continued PT services  PT Problem List Decreased strength;Decreased balance;Decreased activity tolerance;Decreased knowledge of use of DME       PT Treatment Interventions DME instruction;Gait training;Functional mobility training;Therapeutic activities;Therapeutic exercise;Balance training;Patient/family education    PT Goals (Current goals can be found in the Care Plan section)  Acute Rehab PT Goals PT Goal Formulation: Patient unable to participate in goal setting Time For Goal Achievement: 06/10/18 Potential to Achieve Goals: Fair    Frequency Min 2X/week   Barriers to discharge        Co-evaluation               AM-PAC PT "6 Clicks" Mobility  Outcome Measure Help needed turning from your back to your side while in a flat bed without using bedrails?: A Lot Help needed moving from lying on your back to sitting on the side of a flat bed without using bedrails?: A Lot Help needed moving to and from a bed to a chair (including a wheelchair)?: A Lot Help needed standing up from a chair using your arms (e.g., wheelchair or bedside chair)?: A Lot Help needed to walk in hospital room?: Total Help needed climbing 3-5 steps with a railing? : Total 6 Click Score: 10    End of Session Equipment Utilized During Treatment: Gait belt Activity Tolerance: Patient tolerated treatment well Patient left: in chair;with call bell/phone within reach;with chair alarm set Nurse Communication: Mobility status PT Visit Diagnosis: Unsteadiness on feet (R26.81);Muscle weakness (generalized) (M62.81);Difficulty in walking, not elsewhere classified (R26.2)    Time: 1601-0932 PT Time Calculation (min) (ACUTE ONLY): 22 min   Charges:    PT Evaluation $PT Eval Moderate Complexity: 1 Mod          Philomena Doheny PT 05/27/2018  Acute Rehabilitation Services Pager [phone] Office [phone]

## 2018-05-28 DIAGNOSIS — F0390 Unspecified dementia without behavioral disturbance: Secondary | ICD-10-CM

## 2018-05-28 DIAGNOSIS — N390 Urinary tract infection, site not specified: Principal | ICD-10-CM

## 2018-05-28 LAB — BASIC METABOLIC PANEL
Anion gap: 5 (ref 5–15)
BUN: 12 mg/dL (ref 8–23)
CO2: 29 mmol/L (ref 22–32)
Calcium: 8.8 mg/dL — ABNORMAL LOW (ref 8.9–10.3)
Chloride: 106 mmol/L (ref 98–111)
Creatinine, Ser: 0.59 mg/dL (ref 0.44–1.00)
GFR calc Af Amer: >60 mL/min (ref >60)
GFR calc non Af Amer: >60 mL/min (ref >60)
Glucose, Bld: 96 mg/dL (ref 70–99)
Potassium: 3.9 mmol/L (ref 3.5–5.1)
SODIUM: 140 mmol/L (ref 135–145)

## 2018-05-28 LAB — CBC
HCT: 40.9 % (ref 36.0–46.0)
Hemoglobin: 12.9 g/dL (ref 12.0–15.0)
MCH: 32.3 pg (ref 26.0–34.0)
MCHC: 31.5 g/dL (ref 30.0–36.0)
MCV: 102.3 fL — ABNORMAL HIGH (ref 80.0–100.0)
NRBC: 0 % (ref 0.0–0.2)
Platelets: 193 10*3/uL (ref 150–400)
RBC: 4 MIL/uL (ref 3.87–5.11)
RDW: 13.1 % (ref 11.5–15.5)
WBC: 5.7 10*3/uL (ref 4.0–10.5)

## 2018-05-28 LAB — URINE CULTURE
CULTURE: NO GROWTH
SPECIAL REQUESTS: NORMAL

## 2018-05-28 LAB — VITAMIN B12: Vitamin B-12: 1097 pg/mL — ABNORMAL HIGH (ref 180–914)

## 2018-05-28 MED ORDER — FOSFOMYCIN TROMETHAMINE 3 G PO PACK
3.0000 g | PACK | Freq: Once | ORAL | Status: AC
  Administered 2018-05-28: 3 g via ORAL
  Filled 2018-05-28: qty 3

## 2018-05-28 MED ORDER — SENNA 8.6 MG PO TABS
1.0000 | ORAL_TABLET | Freq: Every day | ORAL | Status: DC
  Filled 2018-05-28: qty 1

## 2018-05-28 MED ORDER — CEPHALEXIN 250 MG PO CAPS
250.0000 mg | ORAL_CAPSULE | Freq: Three times a day (TID) | ORAL | Status: DC
  Administered 2018-05-29: 250 mg via ORAL
  Filled 2018-05-28 (×2): qty 1

## 2018-05-28 MED ORDER — DOCUSATE SODIUM 100 MG PO CAPS
100.0000 mg | ORAL_CAPSULE | Freq: Two times a day (BID) | ORAL | Status: DC
  Administered 2018-05-28 – 2018-05-29 (×3): 100 mg via ORAL
  Filled 2018-05-28 (×3): qty 1

## 2018-05-28 MED ORDER — CEPHALEXIN 500 MG PO CAPS: 500.0000 mg | ORAL_CAPSULE | Freq: Three times a day (TID) | ORAL | Status: DC

## 2018-05-28 NOTE — [place] FL2 (Signed)
[place] MEDICAID FL2 LEVEL OF CARE SCREENING TOOL     IDENTIFICATION  Patient Name: Kayla Pace Birthdate: 09-21-1921 Sex: female Admission Date (Current Location): 05/26/2018  [facility] and [place] Number:  Herbalist and Address:  [facility],  [place] [place], [place]      Provider Number: [id number]  Attending Physician Name and Address:  Bonnielee Haff, MD  Relative Name and Phone Number:  Tarri Abernethy, [phone]    Current Level of Care: Hospital Recommended Level of Care: [place] Prior Approval Number:    Date Approved/Denied:   PASRR Number: [id number]  Discharge Plan: SNF    Current Diagnoses: Patient Active Problem List   Diagnosis Date Noted  . Acute lower UTI 05/26/2018  . Failure of outpatient treatment 05/26/2018  . Anxiety and depression 05/26/2018  . Dementia without behavioral disturbance ([place]) 05/26/2018  . Dyslipidemia 05/26/2018  . H/O colon cancer, stage I 05/26/2018  . H/O CHF 05/26/2018  . UTI (urinary tract infection) 05/26/2018  . Failure to thrive in adult 12/07/2015    Orientation RESPIRATION BLADDER Height & Weight     Self  Normal Indwelling catheter Weight: 100 lb 7 oz (45.6 kg) Height:  4\' 11"  (149.9 cm)  BEHAVIORAL SYMPTOMS/MOOD NEUROLOGICAL BOWEL NUTRITION STATUS      Continent Diet(heart healthy)  AMBULATORY STATUS COMMUNICATION OF NEEDS Skin   Extensive Assist Verbally                         Personal Care Assistance Level of Assistance  Bathing, Feeding, Dressing Bathing Assistance: Limited assistance Feeding assistance: Independent Dressing Assistance: Limited assistance     Functional Limitations Info  Sight, Hearing, Speech Sight Info: Adequate Hearing Info: Adequate Speech Info: Adequate    SPECIAL CARE FACTORS FREQUENCY  PT (By licensed PT), OT (By licensed OT)     PT Frequency: 5x wk OT Frequency: 5x wk            Contractures  Contractures Info: Not present    Additional Factors Info  Code Status, Allergies Code Status Info: DNR Allergies Info: CODEINE, MORPHINE           Current Medications (05/28/2018):  This is the current hospital active medication list Current Facility-Administered Medications  Medication Dose Route Frequency Provider Last Rate Last Dose  . 0.9 %  sodium chloride infusion   Intravenous Continuous Quintella Baton, MD 50 mL/hr at 05/27/18 2057    . acetaminophen (TYLENOL) tablet 650 mg  650 mg Oral Q6H PRN Crosley, Debby, MD       Or  . acetaminophen (TYLENOL) suppository 650 mg  650 mg Rectal Q6H PRN Crosley, Debby, MD      . aspirin EC tablet 81 mg  81 mg Oral Daily Crosley, Debby, MD   81 mg at 05/28/18 0912  . bismuth subsalicylate (PEPTO BISMOL) chewable tablet 262 mg  262 mg Oral PRN Quintella Baton, MD      . Derrill Memo ON 05/29/2018] cephALEXin (KEFLEX) capsule 250 mg  250 mg Oral Q8H Bonnielee Haff, MD      . citalopram (CELEXA) tablet 10 mg  10 mg Oral BID Quintella Baton, MD   10 mg at 05/28/18 0912  . digoxin (LANOXIN) tablet 125 mcg  125 mcg Oral Daily Quintella Baton, MD   125 mcg at 05/28/18 0913  . fosfomycin (MONUROL) packet 3 g  3 g Oral Once Bonnielee Haff, MD      .  memantine (NAMENDA) tablet 5 mg  5 mg Oral QID Quintella Baton, MD   5 mg at 05/28/18 0912  . primidone (MYSOLINE) tablet 250 mg  250 mg Oral BID Quintella Baton, MD   250 mg at 05/28/18 0913  . simvastatin (ZOCOR) tablet 10 mg  10 mg Oral QHS Quintella Baton, MD   10 mg at 05/27/18 2301     Discharge Medications: Please see discharge summary for a list of discharge medications.  Relevant Imaging Results:  Relevant Lab Results:   Additional Information SS# [id number]  Wende Neighbors, LCSW

## 2018-05-28 NOTE — Progress Notes (Signed)
Report called to RN on 6E.  

## 2018-05-28 NOTE — Clinical Social Work Note (Signed)
Clinical Social Work Assessment  Patient Details  Name: Kayla Pace MRN: [id number] Date of Birth: [birth year]  Date of referral:  05/28/18               Reason for consult:  Facility Placement, Discharge Planning                Permission sought to share information with:  Family Supports Permission granted to share information::  Yes, Verbal Permission Granted  Name::     Tarri Abernethy  Agency::     Relationship::  Psychologist, forensic Information:  [phone]  Housing/Transportation Living arrangements for the past 2 months:  [place] of Information:  Adult Children Patient Interpreter Needed:  None Criminal Activity/Legal Involvement Pertinent to Current Situation/Hospitalization:  No - Comment as needed Significant Relationships:  Adult Children Lives with:  Adult Children Do you feel safe going back to the place where you live?  Yes Need for family participation in patient care:  No (Coment)  Care giving concerns:  Patient lives at home with daughter and son in law. Patient is only orient to self and was also asleep. CSW spoke with patients daughter Kayla Pace outside of patients room   Social Worker assessment / plan:  CSW met patients daughter at bedside. Leanne stated she would like patient to go to rehab and would prefer [facility] because facility is close to home. Leanne stated she would prefer [facility] but understand that she will need a back up plan if facility is full. CSW went over the process of rehab and daughter stated she understood  Employment status:  Retired Forensic scientist:  Medicare PT Recommendations:  [place] / Referral to community resources:  [place]  Patient/Family's Response to care:  Daughter appreciates CSW role in care  Patient/Family's Understanding of and Emotional Response to Diagnosis, Current Treatment, and Prognosis:  Family agreeable with discharge plan to rehab  Emotional  Assessment Appearance:  Appears stated age Attitude/Demeanor/Rapport:  Engaged Affect (typically observed):  Accepting Orientation:  Oriented to Self Alcohol / Substance use:  Not Applicable Psych involvement (Current and /or in the community):  No (Comment)  Discharge Needs  Concerns to be addressed:  Care Coordination Readmission within the last 30 days:  No Current discharge risk:  Dependent with Mobility Barriers to Discharge:  Continued Medical Work up   [employer], LCSW 05/28/2018, 12:42 PM

## 2018-05-28 NOTE — Progress Notes (Signed)
Progress Note    Kayla Pace  MGQ:[id number] DOB: [birth year]  DOA: 05/26/2018 PCP: Valera Castle, MD    Brief Narrative:    Medical records reviewed and are as summarized below:  Kayla Pace is an 85 y.o. female with history of dementia.  She presents with urinary symptoms complaints. She has a h/o recurrent UTI that was finally resolved for years. Around christmas she had a bad UTI.  She was prescribed nitrofurantoin for 7 days. She was ill again 3 days after completion of the regimen.  A second course of nitrofurantoin was started by PCP. Her UA then came back sensitive to cipro and resistant to nitrofurantoin.  Cipro was substituted.  Her daughter the patient stopped urinating yesterday.  The daughter also reports increased confusion. She has not had any fevers or chills. Her urine is foul smelling.  Assessment/Plan:   Acute lower UTI/? Failure of outpatient treatment (growing klebsiella/enterococcus) WBC normal.  Urine culture without any growth.  Patient apparently had failed outpatient treatment with nitrofurantoin initially and then ciprofloxacin.  Results of urine cultures sent from her primary care physician's office on 05/12/2018 reviewed on care everywhere.  Urine grew Klebsiella and enterococcus.  Klebsiella noted to be sensitive to cefazolin cefuroxime and ciprofloxacin.  Enterococcus susceptible to ampicillin daptomycin and linezolid and nitrofurantoin.  Patient was initiated on IV ceftriaxone.  Remains on the same. Could consider fosfomycin to treat the enterococcus.  Diarrhea Unclear if this was true diarrhea or related to constipation.  X-ray did suggest increased stool burden.    Dementia Seems to be stable.  Continue home medications.  Essential hypertension Continue home medications.  Blood pressure is reasonably well controlled.  Acute urinary retention Patient currently has a Foley catheter in place.  Mobilize.  Voiding trial once she is more  active.  Dyslipidemia Stable  History of congestive heart failure No further details available.  No echocardiogram report available in EMR.  Patient noted to be on digoxin.  Reason for this is not entirely clear.  Noted to be listed as her home medication on her PCP visits as well.  We will continue for now.  History of colon cancer -In remission   Family Communication/Anticipated D/C date and plan/Code Status   DVT prophylaxis: scd Code Status: Full Code.  Family Communication: No family at bedside. Disposition Plan: Will need to go to skilled nursing facility for short-term rehab.   Subjective:   Patient pleasantly confused.  Unable to answer questions appropriately.  Objective:    Vitals:   05/27/18 1402 05/27/18 1446 05/27/18 2253 05/28/18 0633  BP: (!) 191/89 (!) 163/83 (!) 148/73 (!) 144/73  Pulse: 83 69 62 78  Resp: 20  16 18   Temp: 98.6 F (37 C)  97.8 F (36.6 C) 98.7 F (37.1 C)  TempSrc: Oral  Oral Axillary  SpO2: 99% 100% 97% 97%  Weight:      Height:        Intake/Output Summary (Last 24 hours) at 05/28/2018 0902 Last data filed at 05/28/2018 2671 Gross per 24 hour  Intake 1859.87 ml  Output 3100 ml  Net -1240.13 ml   Filed Weights   05/26/18 1811  Weight: 45.6 kg    Exam: General appearance: Awake alert.  In no distress.  Distracted. Resp: Clear to auscultation bilaterally.  Normal effort Cardio: S1-S2 is normal regular.  No S3-S4.  No rubs murmurs or bruit GI: Abdomen is soft.  Nontender nondistended.  Bowel sounds are present normal.  No masses organomegaly Extremities: No edema.  Full range of motion of lower extremities. Neurologic: Disoriented.  No obvious focal neurological deficits.   Data Reviewed:   I have personally reviewed following labs and imaging studies:  Labs:   Basic Metabolic Panel: Recent Labs  Lab 05/26/18 1845 05/27/18 0434 05/28/18 0542  NA 136 142 140  K 4.0 4.5 3.9  CL 97* 104 106  CO2 32 30 29    GLUCOSE 100* 96 96  BUN 11 8 12   CREATININE 0.68 0.67 0.59  CALCIUM 8.7* 8.8* 8.8*   GFR Estimated Creatinine Clearance: 29.3 mL/min (by C-G formula based on SCr of 0.59 mg/dL).  CBC: Recent Labs  Lab 05/26/18 1845 05/27/18 0434 05/28/18 0542  WBC 5.2 5.2 5.7  NEUTROABS 2.3  --   --   HGB 13.9 13.2 12.9  HCT 44.6 41.9 40.9  MCV 102.5* 102.7* 102.3*  PLT 207 197 193   Anemia work up: Recent Labs    05/28/18 0542  VITAMINB12 1,097*   Sepsis Labs: Recent Labs  Lab 05/26/18 1845 05/27/18 0434 05/28/18 0542  WBC 5.2 5.2 5.7    Microbiology Recent Results (from the past 240 hour(s))  Urine culture     Status: None   Collection Time: 05/26/18  6:45 PM  Result Value Ref Range Status   Specimen Description   Final    URINE, CATHETERIZED Performed at [facility], [place] [address] [place], [place] [address]    Special Requests   Final    Normal Performed at [facility], [place] [address], [place], [place] [address]    Culture   Final    NO GROWTH Performed at [facility], [place] [address], [place], [place] [address]    Report Status 05/28/2018 FINAL  Final    Procedures and diagnostic studies:  Dg Abd 1 View  Result Date: 05/27/2018 CLINICAL DATA:  Stool impaction. EXAM: ABDOMEN - 1 VIEW COMPARISON:  07/04/2017 FINDINGS: The bowel gas pattern is normal. A moderate stool burden is identified within the colon. No radio-opaque calculi or other significant radiographic abnormality are seen. IMPRESSION: 1. Nonobstructive bowel gas pattern. 2. Moderate stool burden noted within colon. Electronically Signed   By: Kerby Moors M.D.   On: 05/27/2018 12:12    Medications:   . aspirin EC  81 mg Oral Daily  . citalopram  10 mg Oral BID  . digoxin  125 mcg Oral Daily  . memantine  5 mg Oral QID  . primidone  250 mg Oral BID  . simvastatin  10 mg Oral QHS   Continuous Infusions: . sodium chloride 50 mL/hr at 05/27/18 2057   . cefTRIAXone (ROCEPHIN)  IV 1 g (05/28/18 0522)     LOS: 2 days   [employer]  [facility]   05/28/2018, 9:02 AM

## 2018-05-28 NOTE — Progress Notes (Signed)
PHARMACY NOTE:  ANTIMICROBIAL RENAL DOSAGE ADJUSTMENT  Current antimicrobial regimen includes a mismatch between antimicrobial dosage and estimated renal function.  As per policy approved by the Pharmacy & Therapeutics and Medical Executive Committees, the antimicrobial dosage will be adjusted accordingly.  Current antimicrobial dosage:  Keflex 500mg  q8h  Indication: UTI  Renal Function:  Estimated Creatinine Clearance: 29.3 mL/min (by C-G formula based on SCr of 0.59 mg/dL). []      On intermittent HD, scheduled: []      On CRRT    Antimicrobial dosage has been changed to:  250mg  q8h  Additional comments:   Thank you for allowing pharmacy to be a part of this patient's care.  Peggyann Juba, PharmD, BCPS Pager: [phone] 05/28/2018 12:25 PM

## 2018-05-29 MED ORDER — SENNA 8.6 MG PO TABS
1.0000 | ORAL_TABLET | Freq: Every day | ORAL | Status: DC
  Administered 2018-05-29: 8.6 mg via ORAL
  Filled 2018-05-29: qty 1

## 2018-05-29 MED ORDER — CEPHALEXIN 250 MG PO CAPS: 250.0000 mg | ORAL_CAPSULE | Freq: Three times a day (TID) | ORAL | Status: AC

## 2018-05-29 MED ORDER — DOCUSATE SODIUM 100 MG PO CAPS: 100.0000 mg | ORAL_CAPSULE | Freq: Two times a day (BID) | ORAL | 0 refills | Status: AC

## 2018-05-29 MED ORDER — SENNA 8.6 MG PO TABS: 1.0000 | ORAL_TABLET | Freq: Every day | ORAL | 0 refills | Status: AC

## 2018-05-29 NOTE — Discharge Summary (Addendum)
[facility]  Physician Discharge Summary   Patient ID: Kayla Pace MRN: [id number] DOB/AGE: [birth year] 85 y.o.  Admit date: 05/26/2018 Discharge date: 05/29/2018  PCP: Valera Castle, MD  DISCHARGE DIAGNOSES:  Urinary tract infection Diarrhea Dementia Essential hypertension Acute urinary retention  RECOMMENDATIONS FOR OUTPATIENT FOLLOW UP: 1. May need a voiding trial over at skilled nursing facility if one done here prior to discharge is unsuccessful 2. Continue with bowel regimen   DISCHARGE CONDITION: fair  Diet recommendation: Heart healthy  Filed Weights   05/26/18 1811 05/28/18 1548  Weight: 45.6 kg 48 kg    INITIAL HISTORY: Kayla Pace is an 85 y.o. female with history of dementia.Shepresents with urinary symptoms complaints. She has a h/o recurrent UTI that was finally resolved for years. Around christmas she had a bad UTI.She was prescribed nitrofurantoinfor 7 days. She was ill again 3 days after completion of the regimen.A second course of nitrofurantoin was started by PCP. Her UA then came back sensitive to cipro and resistant tonitrofurantoin.Cipro was substituted. Her daughter the patient stopped urinating yesterday.The daughter also reportsincreased confusion. She has not had any fevers or chills. Her urine is foul smelling.  Consultations:  None  Procedures:  None   HOSPITAL COURSE:   Acute lower UTI/Failure of outpatient treatment (growing klebsiella/enterococcus) Patient has had 2 courses of nitrofurantoin and a partial course of ciprofloxacin as an outpatient.  She presented with increased confusion which was thought to be related to her acute UTI.  Culture sent here in the hospital did not show any growth. Results of urine cultures sent from her primary care physician's office on 05/12/2018 reviewed on care everywhere.  Urine grew Klebsiella and enterococcus.  Klebsiella noted to be sensitive to cefazolin cefuroxime and  ciprofloxacin.  Enterococcus susceptible to ampicillin daptomycin and linezolid and nitrofurantoin. WBC was normal.  Mentation has improved with treatment.  No clear evidence for sepsis at presentation.  Acute urinary retention Early catheter had to be placed for urinary retention.  A voiding trial will be attempted this morning.  If she is not able to void then Foley may have to be reinserted and a voiding trial should then be attempted at the skilled nursing facility after patient's activity level has improved.    Diarrhea in the setting of constipation Unclear if this was true diarrhea or related to constipation.  X-ray did suggest increased stool burden.    She will be discharged on stool softeners and laxatives.  Acute metabolic encephalopathy in the setting of dementia She was initially confused at presentation.  This was most likely due to UTI.  Improved subsequently.  Seems to be stable.  Continue home medications.  Essential hypertension Continue home medications.  Blood pressure is reasonably well controlled.  Dyslipidemia Stable  History of congestive heart failure No further details available.  No echocardiogram report available in EMR.  Patient noted to be on digoxin.  Reason for this is not entirely clear.  Noted to be listed as her home medication on her PCP visits as well.  We will continue for now.  History of colon cancer In remission.  No obstruction noted on abdominal imaging studies.  Overall stable.  Okay for discharge to skilled nursing facility today.    PERTINENT LABS:  The results of significant diagnostics from this hospitalization (including imaging, microbiology, ancillary and laboratory) are listed below for reference.    Microbiology: Recent Results (from the past 240 hour(s))  Urine culture     Status: None  Collection Time: 05/26/18  6:45 PM  Result Value Ref Range Status   Specimen Description   Final    URINE, CATHETERIZED Performed at  [facility] [address], [place], [place] [address]    Special Requests   Final    Normal Performed at [facility], [place] [address] [place], [place] [address]    Culture   Final    NO GROWTH Performed at [facility], [place] [address], [place], [place] [address]    Report Status 05/28/2018 FINAL  Final     Labs: Basic Metabolic Panel: Recent Labs  Lab 05/26/18 1845 05/27/18 0434 05/28/18 0542  NA 136 142 140  K 4.0 4.5 3.9  CL 97* 104 106  CO2 32 30 29  GLUCOSE 100* 96 96  BUN 11 8 12   CREATININE 0.68 0.67 0.59  CALCIUM 8.7* 8.8* 8.8*   CBC: Recent Labs  Lab 05/26/18 1845 05/27/18 0434 05/28/18 0542  WBC 5.2 5.2 5.7  NEUTROABS 2.3  --   --   HGB 13.9 13.2 12.9  HCT 44.6 41.9 40.9  MCV 102.5* 102.7* 102.3*  PLT 207 197 193     IMAGING STUDIES Dg Abd 1 View  Result Date: 05/27/2018 CLINICAL DATA:  Stool impaction. EXAM: ABDOMEN - 1 VIEW COMPARISON:  07/04/2017 FINDINGS: The bowel gas pattern is normal. A moderate stool burden is identified within the colon. No radio-opaque calculi or other significant radiographic abnormality are seen. IMPRESSION: 1. Nonobstructive bowel gas pattern. 2. Moderate stool burden noted within colon. Electronically Signed   By: Kerby Moors M.D.   On: 05/27/2018 12:12    DISCHARGE EXAMINATION: Vitals:   05/28/18 1358 05/28/18 1548 05/28/18 2118 05/29/18 0506  BP: 136/74 137/65 140/75 (!) 156/73  Pulse: 72 74 72 75  Resp: 14 16 16 16   Temp: 98.2 F (36.8 C) 98.7 F (37.1 C) 98.1 F (36.7 C) 97.9 F (36.6 C)  TempSrc: Oral Oral Oral Oral  SpO2: 95% 95% 95% 96%  Weight:  48 kg    Height:  4\' 11"  (1.499 m)     General appearance: Awake alert.  In no distress.  Distracted. Resp: Clear to auscultation bilaterally.  Normal effort Cardio: S1-S2 is normal regular.  No S3-S4.  No rubs murmurs or bruit GI: Abdomen is soft.  Nontender nondistended.  Bowel sounds are present normal.  No  masses organomegaly   DISPOSITION: SNF  Discharge Instructions    Call MD for:  difficulty breathing, headache or visual disturbances   Complete by:  As directed    Call MD for:  extreme fatigue   Complete by:  As directed    Call MD for:  persistant dizziness or light-headedness   Complete by:  As directed    Call MD for:  persistant nausea and vomiting   Complete by:  As directed    Call MD for:  severe uncontrolled pain   Complete by:  As directed    Call MD for:  temperature >100.4   Complete by:  As directed    Discharge instructions   Complete by:  As directed    Please review instructions on the discharge summary.  You were cared for by a hospitalist during your hospital stay. If you have any questions about your discharge medications or the care you received while you were in the hospital after you are discharged, you can call the unit and asked to speak with the hospitalist on call if  the hospitalist that took care of you is not available. Once you are discharged, your primary care physician will handle any further medical issues. Please note that NO REFILLS for any discharge medications will be authorized once you are discharged, as it is imperative that you return to your primary care physician (or establish a relationship with a primary care physician if you do not have one) for your aftercare needs so that they can reassess your need for medications and monitor your lab values. If you do not have a primary care physician, you can call [phone] for a physician referral.   Increase activity slowly   Complete by:  As directed          Allergies as of 05/29/2018      Reactions   Codeine Nausea And Vomiting   Morphine Other (See Comments)   Hallucinations      Medication List    STOP taking these medications   loperamide 2 MG tablet Commonly known as:  IMODIUM A-D   ondansetron 4 MG tablet Commonly known as:  ZOFRAN   oxyCODONE-acetaminophen 5-325 MG  tablet Commonly known as:  PERCOCET/ROXICET     TAKE these medications   acetaminophen 500 MG tablet Commonly known as:  TYLENOL Take 500 mg by mouth every 6 (six) hours as needed for moderate pain.   ADVIL PM 200-25 MG Caps Generic drug:  Ibuprofen-diphenhydrAMINE HCl Take 2 capsules by mouth at bedtime.   aspirin 81 MG tablet Take 1 tablet by mouth daily.   bismuth subsalicylate 494 MG chewable tablet Commonly known as:  PEPTO BISMOL Chew 262 mg by mouth as needed for indigestion.   CENTRUM SILVER ADULT 50+ Tabs Take 1 tablet by mouth daily.   cephALEXin 250 MG capsule Commonly known as:  KEFLEX Take 1 capsule (250 mg total) by mouth every 8 (eight) hours for 5 days.   citalopram 20 MG tablet Commonly known as:  CELEXA Take 10 mg by mouth 2 (two) times daily.   clotrimazole 1 % cream Commonly known as:  LOTRIMIN Apply 1 application topically as directed.   digoxin 0.125 MG tablet Commonly known as:  LANOXIN Take 1 tablet by mouth daily.   docusate sodium 100 MG capsule Commonly known as:  COLACE Take 1 capsule (100 mg total) by mouth 2 (two) times daily.   memantine 5 MG tablet Commonly known as:  NAMENDA Take 5 mg by mouth 4 (four) times daily.   primidone 250 MG tablet Commonly known as:  MYSOLINE Take 1 tablet by mouth 2 (two) times daily.   senna 8.6 MG Tabs tablet Commonly known as:  SENOKOT Take 1 tablet (8.6 mg total) by mouth daily.   simvastatin 10 MG tablet Commonly known as:  ZOCOR Take 1 tablet by mouth at bedtime.   VISINE OP Apply 1 drop to eye daily as needed (dry eyes).   vitamin C 1000 MG tablet Take 1 tablet by mouth daily.   VITAMIN D PO Take 1 tablet by mouth daily.        Follow-up Information    Kym Groom Guy Begin, MD. Schedule an appointment as soon as possible for a visit in 2 week(s).   Specialty:  Family Medicine Contact information: [place] [address] [phone]           TOTAL  DISCHARGE TIME: 56 minutes  [facility] Pager on [email or web address]  05/29/2018, 11:02 AM

## 2018-05-29 NOTE — Care Management Important Message (Signed)
Important Message  Patient Details  Name: Jessah Danser MRN: [id number] Date of Birth: 1921/12/21   Medicare Important Message Given:  Yes    Kerin Salen 05/29/2018, 10:29 [place] Message  Patient Details  Name: Gitty Osterlund MRN: [id number] Date of Birth: [birth year]   Medicare Important Message Given:  Yes    Kerin Salen 05/29/2018, 10:29 AM

## 2018-05-29 NOTE — Discharge Instructions (Signed)
Urinary Tract Infection, Adult A urinary tract infection (UTI) is an infection of any part of the urinary tract. The urinary tract includes:  The kidneys.  The ureters.  The bladder.  The urethra. These organs make, store, and get rid of pee (urine) in the body. What are the causes? This is caused by germs (bacteria) in your genital area. These germs grow and cause swelling (inflammation) of your urinary tract. What increases the risk? You are more likely to develop this condition if:  You have a small, thin tube (catheter) to drain pee.  You cannot control when you pee or poop (incontinence).  You are female, and: ? You use these methods to prevent pregnancy: ? A medicine that kills sperm (spermicide). ? A device that blocks sperm (diaphragm). ? You have low levels of a female hormone (estrogen). ? You are pregnant.  You have genes that add to your risk.  You are sexually active.  You take antibiotic medicines.  You have trouble peeing because of: ? A prostate that is bigger than normal, if you are female. ? A blockage in the part of your body that drains pee from the bladder (urethra). ? A kidney stone. ? A nerve condition that affects your bladder (neurogenic bladder). ? Not getting enough to drink. ? Not peeing often enough.  You have other conditions, such as: ? Diabetes. ? A weak disease-fighting system (immune system). ? Sickle cell disease. ? Gout. ? Injury of the spine. What are the signs or symptoms? Symptoms of this condition include:  Needing to pee right away (urgently).  Peeing often.  Peeing small amounts often.  Pain or burning when peeing.  Blood in the pee.  Pee that smells bad or not like normal.  Trouble peeing.  Pee that is cloudy.  Fluid coming from the vagina, if you are female.  Pain in the belly or lower back. Other symptoms include:  Throwing up (vomiting).  No urge to eat.  Feeling mixed up (confused).  Being tired  and grouchy (irritable).  A fever.  Watery poop (diarrhea). How is this treated? This condition may be treated with:  Antibiotic medicine.  Other medicines.  Drinking enough water. Follow these instructions at home:  Medicines  Take over-the-counter and prescription medicines only as told by your doctor.  If you were prescribed an antibiotic medicine, take it as told by your doctor. Do not stop taking it even if you start to feel better. General instructions  Make sure you: ? Pee until your bladder is empty. ? Do not hold pee for a long time. ? Empty your bladder after sex. ? Wipe from front to back after pooping if you are a female. Use each tissue one time when you wipe.  Drink enough fluid to keep your pee pale yellow.  Keep all follow-up visits as told by your doctor. This is important. Contact a doctor if:  You do not get better after 1-2 days.  Your symptoms go away and then come back. Get help right away if:  You have very bad back pain.  You have very bad pain in your lower belly.  You have a fever.  You are sick to your stomach (nauseous).  You are throwing up. Summary  A urinary tract infection (UTI) is an infection of any part of the urinary tract.  This condition is caused by germs in your genital area.  There are many risk factors for a UTI. These include having a small, thin   tube to drain pee and not being able to control when you pee or poop.  Treatment includes antibiotic medicines for germs.  Drink enough fluid to keep your pee pale yellow. This information is not intended to replace advice given to you by your health care provider. Make sure you discuss any questions you have with your health care provider. Document Released: 10/09/2007 Document Revised: 10/30/2017 Document Reviewed: 10/30/2017 Elsevier Interactive Patient Education  2019 Elsevier Inc.  

## 2018-05-29 NOTE — Clinical Social Work Placement (Signed)
Pt admitting to [facility] room 104A- report [phone] Arranged PTAR transportation   [facility]  NOTE  Date:  05/29/2018  Patient Details  Name: Kayla Pace MRN: [id number] Date of Birth: 05-Jan-1922  Clinical Social Work is seeking post-discharge placement for this patient at the [place] level of care (*CSW will initial, date and re-position this form in  chart as items are completed):  Yes   Patient/family provided with [facility] Work Department's list of facilities offering this level of care within the geographic area requested by the patient (or if unable, by the patient's family).  Yes   Patient/family informed of their freedom to choose among providers that offer the needed level of care, that participate in Medicare, Medicaid or managed care program needed by the patient, have an available bed and are willing to accept the patient.  Yes   Patient/family informed of [facility]'s ownership interest in [facility] and [facility], as well as of the fact that they are under no obligation to receive care at these facilities.  PASRR submitted to EDS on       PASRR number received on       Existing PASRR number confirmed on 05/28/18     FL2 transmitted to all facilities in geographic area requested by pt/family on 05/28/18     FL2 transmitted to all facilities within larger geographic area on       Patient informed that his/her managed care company has contracts with or will negotiate with certain facilities, including the following:        Yes   Patient/family informed of bed offers received.  Patient chooses bed at [place], [place]     Physician recommends and patient chooses bed at [facility], [place]    Patient to be transferred to [facility], [place] on 05/29/18.  Patient to be transferred to facility by PTAR     Patient family notified on 05/29/18 of transfer.  Name of  family member notified:     Daughter [facility]  PHYSICIAN       Additional Comment:    _______________________________________________ Nila Nephew, LCSW 05/29/2018, 1:13 PM [phone]

## 2018-07-22 ENCOUNTER — Emergency Department (HOSPITAL_COMMUNITY)
Admission: EM | Admit: 2018-07-22 | Discharge: 2018-07-22 | Disposition: A | Discharge status: Home / Self Care | Payer: Medicare Other | Attending: Emergency Medicine | Admitting: Emergency Medicine

## 2018-07-22 ENCOUNTER — Other Ambulatory Visit: Payer: Self-pay

## 2018-07-22 ENCOUNTER — Emergency Department (HOSPITAL_COMMUNITY): Payer: Medicare Other

## 2018-07-22 ENCOUNTER — Encounter (HOSPITAL_COMMUNITY): Payer: Self-pay

## 2018-07-22 DIAGNOSIS — I509 Heart failure, unspecified: Secondary | ICD-10-CM | POA: Insufficient documentation

## 2018-07-22 DIAGNOSIS — Z85038 Personal history of other malignant neoplasm of large intestine: Secondary | ICD-10-CM | POA: Insufficient documentation

## 2018-07-22 DIAGNOSIS — F0390 Unspecified dementia without behavioral disturbance: Secondary | ICD-10-CM | POA: Diagnosis not present

## 2018-07-22 DIAGNOSIS — R7989 Other specified abnormal findings of blood chemistry: Secondary | ICD-10-CM | POA: Diagnosis present

## 2018-07-22 DIAGNOSIS — Z7982 Long term (current) use of aspirin: Secondary | ICD-10-CM | POA: Insufficient documentation

## 2018-07-22 DIAGNOSIS — Z79899 Other long term (current) drug therapy: Secondary | ICD-10-CM | POA: Diagnosis not present

## 2018-07-22 DIAGNOSIS — R197 Diarrhea, unspecified: Secondary | ICD-10-CM | POA: Diagnosis not present

## 2018-07-22 LAB — CBC
HCT: 46.4 % — ABNORMAL HIGH (ref 36.0–46.0)
Hemoglobin: 14.5 g/dL (ref 12.0–15.0)
MCH: 32.2 pg (ref 26.0–34.0)
MCHC: 31.3 g/dL (ref 30.0–36.0)
MCV: 102.9 fL — ABNORMAL HIGH (ref 80.0–100.0)
PLATELETS: 208 10*3/uL (ref 150–400)
RBC: 4.51 MIL/uL (ref 3.87–5.11)
RDW: 13.3 % (ref 11.5–15.5)
WBC: 5.1 10*3/uL (ref 4.0–10.5)
nRBC: 0 % (ref 0.0–0.2)

## 2018-07-22 LAB — URINALYSIS, ROUTINE W REFLEX MICROSCOPIC
Bilirubin Urine: NEGATIVE
Glucose, UA: NEGATIVE mg/dL
Hgb urine dipstick: NEGATIVE
Ketones, ur: NEGATIVE mg/dL
Nitrite: NEGATIVE
Protein, ur: NEGATIVE mg/dL
SPECIFIC GRAVITY, URINE: 1.01 (ref 1.005–1.030)
pH: 6 (ref 5.0–8.0)

## 2018-07-22 LAB — COMPREHENSIVE METABOLIC PANEL
ALK PHOS: 87 U/L (ref 38–126)
ALT: 25 U/L (ref 0–44)
AST: 30 U/L (ref 15–41)
Albumin: 3.7 g/dL (ref 3.5–5.0)
Anion gap: 7 (ref 5–15)
BUN: 11 mg/dL (ref 8–23)
CO2: 32 mmol/L (ref 22–32)
CREATININE: 0.77 mg/dL (ref 0.44–1.00)
Calcium: 9 mg/dL (ref 8.9–10.3)
Chloride: 100 mmol/L (ref 98–111)
GFR calc Af Amer: >60 mL/min (ref >60)
GFR calc non Af Amer: >60 mL/min (ref >60)
Glucose, Bld: 106 mg/dL — ABNORMAL HIGH (ref 70–99)
Potassium: 4.3 mmol/L (ref 3.5–5.1)
Sodium: 139 mmol/L (ref 135–145)
Total Bilirubin: 0.5 mg/dL (ref 0.3–1.2)
Total Protein: 7.5 g/dL (ref 6.5–8.1)

## 2018-07-22 LAB — LIPASE, BLOOD: Lipase: 43 U/L (ref 11–51)

## 2018-07-22 MED ORDER — IOPAMIDOL (ISOVUE-300) INJECTION 61%
INTRAVENOUS | Status: AC
  Filled 2018-07-22: qty 100

## 2018-07-22 MED ORDER — SODIUM CHLORIDE (PF) 0.9 % IJ SOLN
INTRAMUSCULAR | Status: AC
  Filled 2018-07-22: qty 50

## 2018-07-22 MED ORDER — IOPAMIDOL (ISOVUE-300) INJECTION 61%
100.0000 mL | Freq: Once | INTRAVENOUS | Status: AC | PRN
  Administered 2018-07-22: 100 mL via INTRAVENOUS

## 2018-07-22 MED ORDER — SODIUM CHLORIDE 0.9% FLUSH
3.0000 mL | Freq: Once | INTRAVENOUS | Status: AC
  Administered 2018-07-22: 3 mL via INTRAVENOUS

## 2018-07-22 NOTE — Progress Notes (Addendum)
2:49pm-CSW has faxed over AVS to facility at this time. There are no further CSW needs. Signing off at this time.   2:38pm-CSW has scheduled PTAR at this time. Pt will go 405B.  2:33pm-CSW spoke with Levada Dy from [facility] and was informed that they can take pt today. CSW was asked to have RN call report to [phone]. CSW has updated MD and RN as well as pt's daughter. CSW will fax over AVS to [facility] at [phone] once in.   CSW consulted for SNF placement. CSW spoke with pt's daughter Joslyn Devon via phone. CSW was advised that pt was seen in her PCP office and was said to possibly have C-diff. Per daughter she is unable to care for pt at home and was advised by pt's PCP to bring pt to the ED. CSW was advised that pt does have a ned at [facility]. CSW spoke with Levada Dy from the facility. Levada Dy currently working on getting pt to the facility.     Virgie Dad Jamerion Cabello, MSW, [facility] Clinical Social Worker [phone]

## 2018-07-22 NOTE — ED Notes (Signed)
Patient transported to CT 

## 2018-07-22 NOTE — ED Provider Notes (Signed)
[facility] DEPT Provider Note   CSN: [id number] Arrival date & time: 07/22/18  1039    History   Chief Complaint Chief Complaint  Patient presents with  . Abnormal Lab  . Diarrhea   Level 5 caveat: Dementia  HPI Kayla Pace is a 85 y.o. female.     HPI Patient is a 85 year old female who is sent to the emergency department today for ongoing diarrhea and possible C. difficile colitis.  There is concern from family that she could be constipated and having explosive diarrhea surrounding this.  She is had ongoing diarrhea since January.  She was recently tested for C. difficile at [facility] and was found to be C. difficile PCR positive with negative toxins demonstrating possible chronic colonization without active infection.  Family states that they are having more difficulty caring for her at home given her diarrhea and distress for which it is placing on the family.  Family has been in contact with the nursing facility who reports that they have a bed available.  No fevers or chills.  Eating and drinking normally.  No other complaints.  Patient with a history of dementia and cannot provide any reasonable history.  Baseline for the patient per family.    Past Medical History:  Diagnosis Date  . Anxiety   . Anxiety and depression   . Arthritis   . Cancer of skin of neck 1983   "right side"  . CHF (congestive heart failure) ([place])   . Colon cancer ([place]) ~ 2015  . Dementia ([place])   . Depression   . Fall at home ?12/05/2015   minimally displaced tibial tuberosity fracture right knee  . High cholesterol   . Irregular heart beats   . Meningitis 1926   caused right foot deformity   . Occasional tremors     Patient Active Problem List   Diagnosis Date Noted  . Acute lower UTI 05/26/2018  . Failure of outpatient treatment 05/26/2018  . Anxiety and depression 05/26/2018  . Dementia without behavioral disturbance ([place]) 05/26/2018  . Dyslipidemia  05/26/2018  . H/O colon cancer, stage I 05/26/2018  . H/O CHF 05/26/2018  . UTI (urinary tract infection) 05/26/2018  . Failure to thrive in adult 12/07/2015    Past Surgical History:  Procedure Laterality Date  . CATARACT EXTRACTION W/ INTRAOCULAR LENS  IMPLANT, BILATERAL Bilateral   . COLON SURGERY  2016   "had part of it taken out; in [place]"  . FRACTURE SURGERY    . HIP FRACTURE SURGERY Right   . SKIN CANCER EXCISION  1983   "took radiation shots too"     OB History   No obstetric history on file.      Home Medications    Prior to Admission medications   Medication Sig Start Date End Date Taking? Authorizing Provider  acetaminophen (TYLENOL) 500 MG tablet Take 500 mg by mouth every 6 (six) hours as needed for moderate pain.   Yes [provider]  Ascorbic Acid (VITAMIN C) 1000 MG tablet Take 1 tablet by mouth daily.   Yes [provider]  aspirin 81 MG tablet Take 1 tablet by mouth daily. 02/09/08  Yes [provider]  Cholecalciferol (VITAMIN D PO) Take 1 tablet by mouth daily.   Yes [provider]  citalopram (CELEXA) 20 MG tablet Take 10 mg by mouth 2 (two) times daily.  05/12/18  Yes [provider]  clotrimazole (LOTRIMIN) 1 % cream Apply 1  application topically daily as needed (open sores.).  03/19/17  Yes [provider]  digoxin (LANOXIN) 0.125 MG tablet Take 2 tablets by mouth daily.  03/25/12  Yes [provider]  Ibuprofen-diphenhydrAMINE HCl (ADVIL PM) 200-25 MG CAPS Take 2 capsules by mouth at bedtime.   Yes [provider]  Multiple Vitamins-Minerals (CENTRUM SILVER ADULT 50+) TABS Take 1 tablet by mouth daily.   Yes [provider]  primidone (MYSOLINE) 250 MG tablet Take 1 tablet by mouth 2 (two) times daily. 02/08/13  Yes [provider]  senna (SENOKOT) 8.6 MG TABS tablet Take 1 tablet (8.6 mg total) by mouth daily. Patient taking differently: Take 1 tablet by  mouth at bedtime.  05/29/18  Yes Bonnielee Haff, MD  simvastatin (ZOCOR) 10 MG tablet Take 1 tablet by mouth at bedtime. 03/25/12  Yes [provider]  Tetrahydrozoline HCl (VISINE OP) Apply 1 drop to eye daily as needed (dry eyes).   Yes [provider]  docusate sodium (COLACE) 100 MG capsule Take 1 capsule (100 mg total) by mouth 2 (two) times daily. Patient not taking: Reported on 07/22/2018 05/29/18   Bonnielee Haff, MD  memantine (NAMENDA) 5 MG tablet Take 5 mg by mouth 4 (four) times daily. 03/17/17   [provider]    Family History History reviewed. No pertinent family history.  Social History Social History   Tobacco Use  . Smoking status: Never Smoker  . Smokeless tobacco: Never Used  Substance Use Topics  . Alcohol use: No  . Drug use: No     Allergies   Codeine and Morphine   Review of Systems Review of Systems  Unable to perform ROS: Dementia     Physical Exam Updated Vital Signs BP (!) 162/87   Pulse 70   Temp 98 F (36.7 C) (Oral) Comment: Simultaneous filing. User may not have seen previous data. Comment (Src): Simultaneous filing. User may not have seen previous data.  Resp (!) 21   Ht 4\' 11"  (1.499 m)   Wt 48.5 kg   SpO2 96%   BMI 21.61 kg/m   Physical Exam Vitals signs and nursing note reviewed.  Constitutional:      General: She is not in acute distress.    Appearance: She is well-developed.  HENT:     Head: Normocephalic and atraumatic.  Neck:     Musculoskeletal: Normal range of motion.  Cardiovascular:     Rate and Rhythm: Normal rate and regular rhythm.     Heart sounds: Normal heart sounds.  Pulmonary:     Effort: Pulmonary effort is normal.     Breath sounds: Normal breath sounds.  Abdominal:     General: There is no distension.     Palpations: Abdomen is soft.     Tenderness: There is no abdominal tenderness.  Musculoskeletal: Normal range of motion.  Skin:    General: Skin is warm and dry.   Neurological:     General: No focal deficit present.     Mental Status: She is alert.  Psychiatric:        Judgment: Judgment normal.      ED Treatments / Results  Labs (all labs ordered are listed, but only abnormal results are displayed) Labs Reviewed  COMPREHENSIVE METABOLIC PANEL - Abnormal; Notable for the following components:      Result Value   Glucose, Bld 106 (*)    All other components within normal limits  CBC - Abnormal; Notable for the following  components:   HCT 46.4 (*)    MCV 102.9 (*)    All other components within normal limits  URINALYSIS, ROUTINE W REFLEX MICROSCOPIC - Abnormal; Notable for the following components:   APPearance HAZY (*)    Leukocytes,Ua TRACE (*)    Bacteria, UA FEW (*)    All other components within normal limits  LIPASE, BLOOD    EKG None  Radiology Ct Abdomen Pelvis W Contrast  Result Date: 07/22/2018 CLINICAL DATA:  Diarrhea since January, 2020. EXAM: CT ABDOMEN AND PELVIS WITH CONTRAST TECHNIQUE: Multidetector CT imaging of the abdomen and pelvis was performed using the standard protocol following bolus administration of intravenous contrast. CONTRAST:  100 mL ISOVUE-300 IOPAMIDOL (ISOVUE-300) INJECTION 61% COMPARISON:  CT abdomen and pelvis 07/04/2017. FINDINGS: Lower chest: No pleural or pericardial effusion. Cardiomegaly noted. Lung bases demonstrate mild dependent atelectasis. Hepatobiliary: No focal liver abnormality is seen. No gallstones, gallbladder wall thickening, or biliary dilatation. Pancreas: Unremarkable. No pancreatic ductal dilatation or surrounding inflammatory changes. Spleen: Normal in size without focal abnormality. Adrenals/Urinary Tract: The adrenal glands appear normal. Two small left renal cysts are unchanged. The right kidney appears normal. Ureters are unremarkable. Stomach/Bowel: Scattered diverticula are seen along the descending and sigmoid colon. The patient is status post right hemicolectomy without  evidence of complication. The stomach and small bowel appear normal. Vascular/Lymphatic: Aortic atherosclerosis. No enlarged abdominal or pelvic lymph nodes. Reproductive: Uterus and bilateral adnexa are unremarkable. Other: None. Musculoskeletal: No acute or focal abnormality. The patient is status post right hip replacement. Convex left lumbar scoliosis and mild to moderate degenerative disease noted. IMPRESSION: No acute abnormality or finding to explain the patient's symptoms. Cardiomegaly. Atherosclerosis. Diverticulosis without diverticulitis. Electronically Signed   By: Inge Rise M.D.   On: 07/22/2018 13:20    Procedures Procedures (including critical care time)  Medications Ordered in ED Medications  sodium chloride flush (NS) 0.9 % injection 3 mL (3 mLs Intravenous Given 07/22/18 1147)  iopamidol (ISOVUE-300) 61 % injection 100 mL (100 mLs Intravenous Contrast Given 07/22/18 1244)     Initial Impression / Assessment and Plan / ED Course  I have reviewed the triage vital signs and the nursing notes.  Pertinent labs & imaging results that were available during my care of the patient were reviewed by me and considered in my medical decision making (see chart for details).        Labs without significant abnormality.  I suspect this is chronic colonization for the patient.  She will need C. difficile rechecked as an outpatient.  I do not think she needs acute hospitalization at this time.  She has had ongoing diarrhea since January.  She will need ongoing follow-up with her gastroenterology and primary care team.  She may need to be retested for C. difficile.  She has not been able to give a stool sample here for testing.  I spoke with social work who is arranging transport to skilled nursing for the patient which is the family's request.  Note occasion for additional work-up in the ER.  Patient and family updated.  Plan for transfer to the skilled nursing facility at 3:30 PM  Final  Clinical Impressions(s) / ED Diagnoses   Final diagnoses:  None    ED Discharge Orders    None       Jola Schmidt, MD 07/22/18 1438

## 2018-07-22 NOTE — Discharge Instructions (Addendum)
Please call your primary care team for follow-up  You may need to be rechecked for C. difficile in the future  Please return to the emergency department for new or worsening symptoms

## 2018-07-22 NOTE — Progress Notes (Signed)
CSW spoke with Levada Dy with [facility] regarding questions of patient being on precautions. Patient ready for transport.   Kingsley Spittle, [place]  [phone]

## 2018-07-22 NOTE — ED Triage Notes (Signed)
Patient was sent from PCP today for a possible infection of her stool. Patient's family reports explosive diarrhea since January. Patient has already received results from stool samples collected 2 days ago.

## 2019-09-02 IMAGING — DX DG TIBIA/FIBULA 2V*R*
2 series · 2 of 2 positions shown · non-contrast
Comparison: None.

CLINICAL DATA: Status post fall, right leg pain

EXAM:
RIGHT TIBIA AND FIBULA - 2 VIEW

[tibia ap]
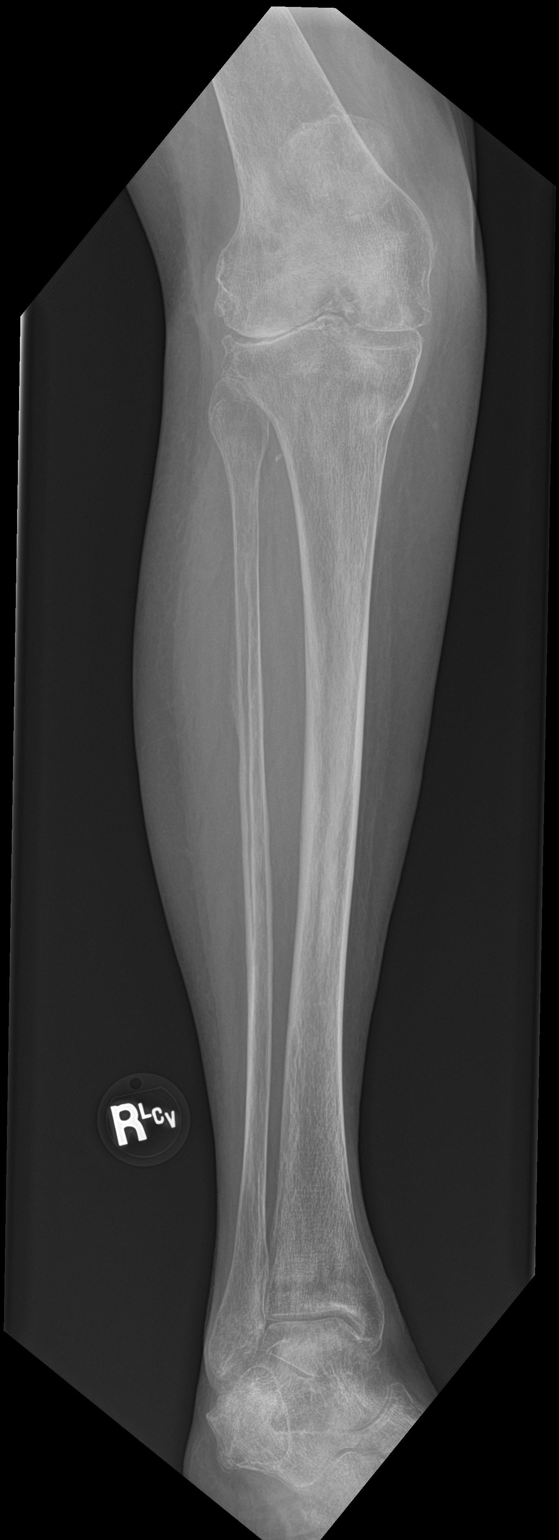

[tibia lat]
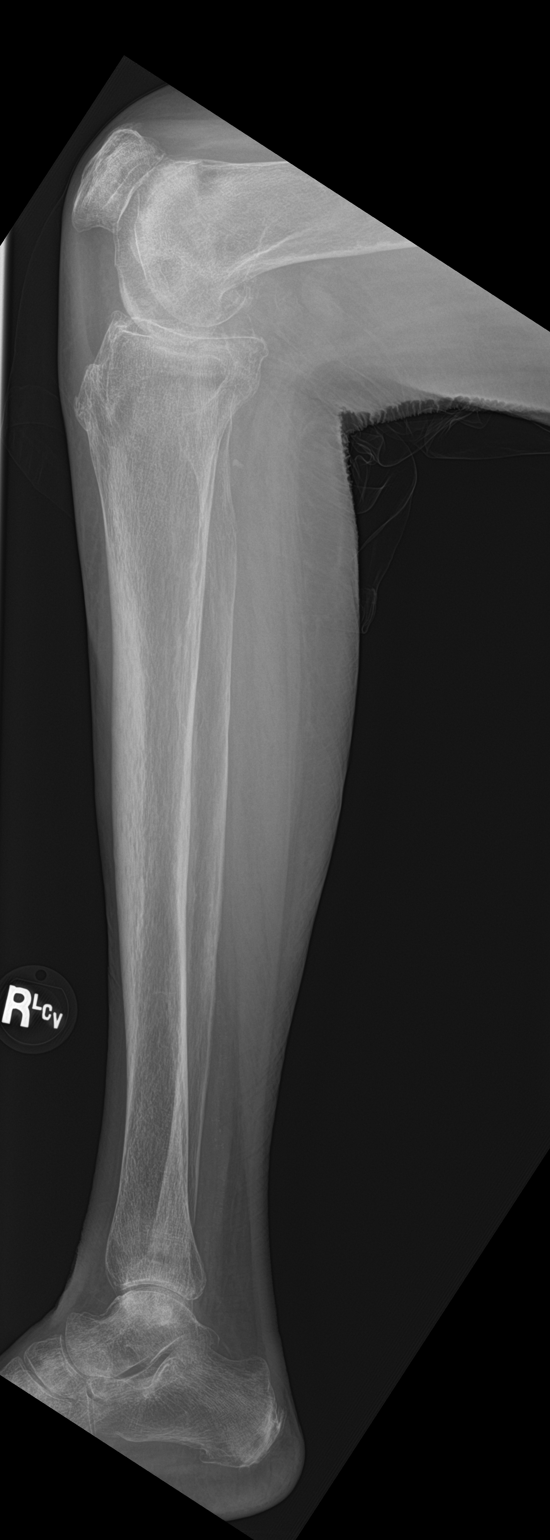

[2 of 2 positions shown; findings below may reference images not displayed]

FINDINGS: Generalized osteopenia. No acute fracture or dislocation. No
aggressive osseous lesion. Severe right lateral femorotibial
compartment joint space narrowing. Moderate medial femorotibial
compartment joint space narrowing.
IMPRESSION: No acute osseous injury of the right tibia or fibula.

## 2019-09-28 IMAGING — CT CT ABD-PELV W/ CM
2 of 5 series · 17 of 46 positions shown, 19 images · IV contrast (APPLIED)
Comparison: Radiographs 06/08/2017

CLINICAL DATA: Watery diarrhea

EXAM:
CT ABDOMEN AND PELVIS WITH CONTRAST
TECHNIQUE: Multidetector CT imaging of the abdomen and pelvis was performed
using the standard protocol following bolus administration of
intravenous contrast.
CONTRAST:  75mL MO2EEW-UKK IOPAMIDOL (MO2EEW-UKK) INJECTION 61%

[Series 2: axial (person_name) (person_name) · axial · 0.68mm/px · z∈[-978,-638]mm · 14 of 76 slices shown, 16 images]
[im 4/76  soft-tissue]
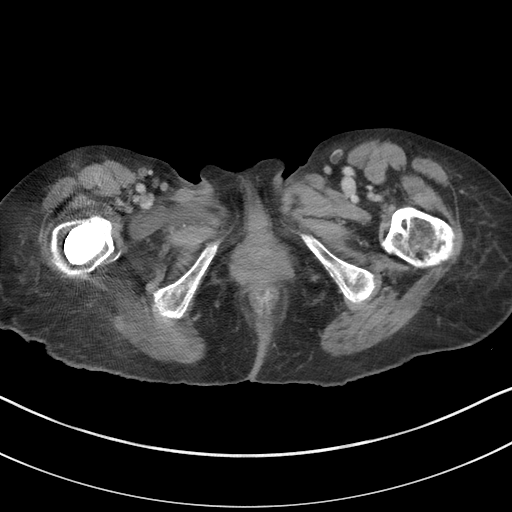
[im 4/76  bone]
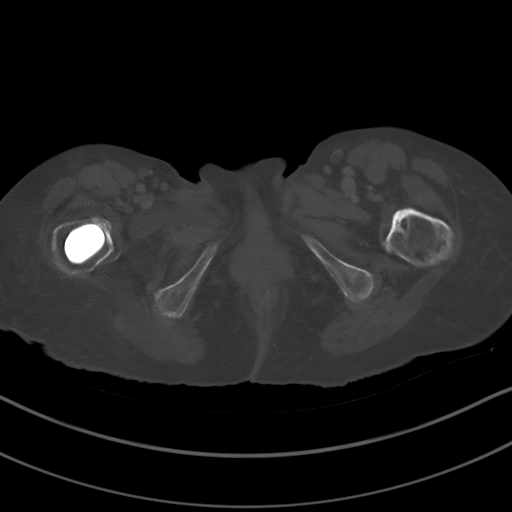
[im 12/76  soft-tissue]
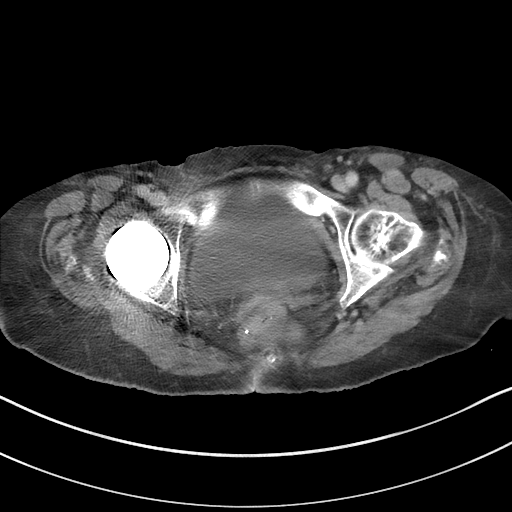
[im 16/76  soft-tissue]
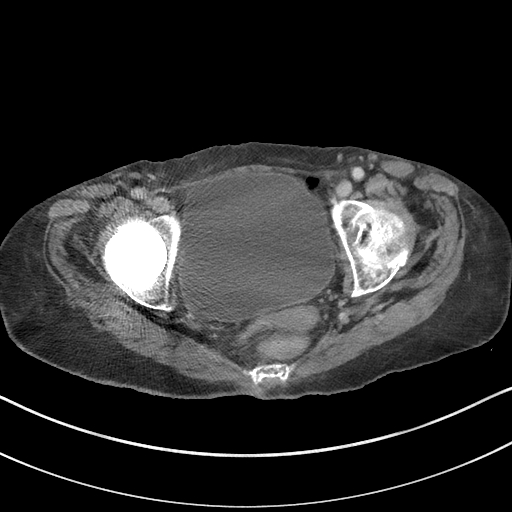
[im 19/76  soft-tissue]
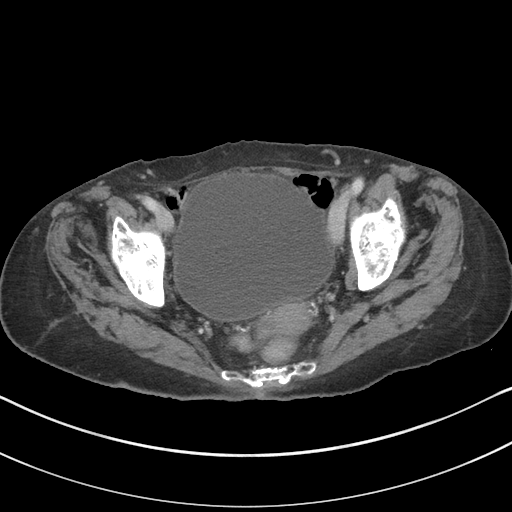
[im 27/76  soft-tissue]
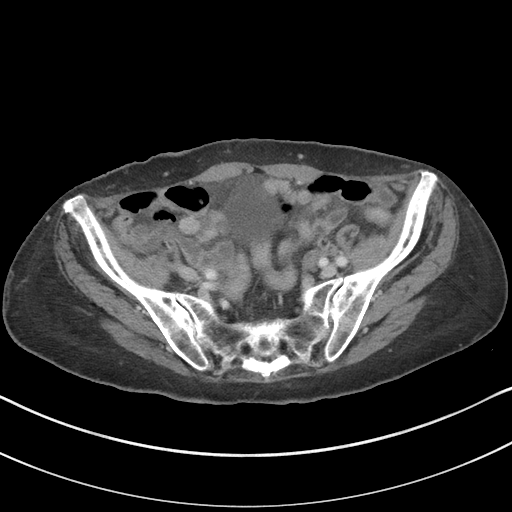
[im 31/76  soft-tissue]
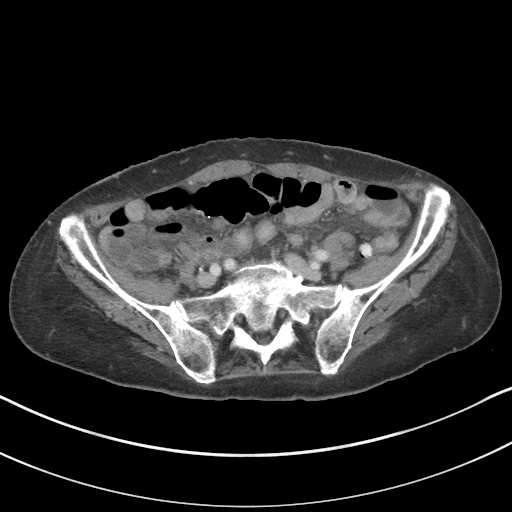
[im 34/76  soft-tissue]
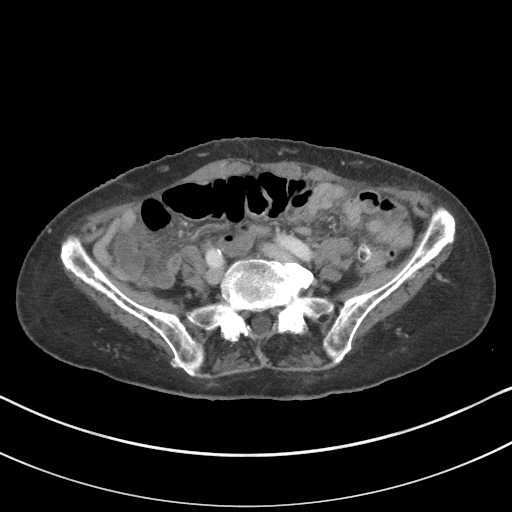
[im 42/76  soft-tissue]
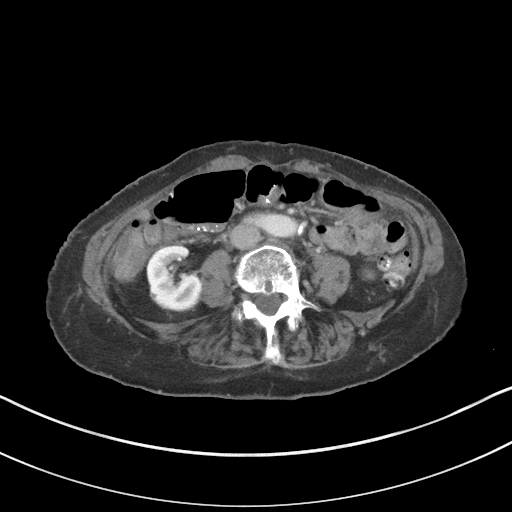
[im 46/76  soft-tissue]
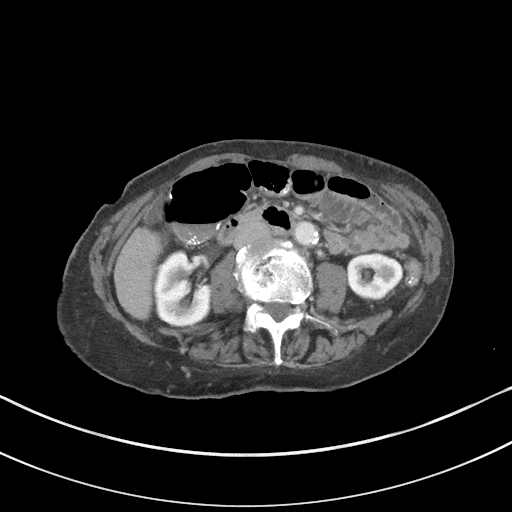
[im 46/76  bone]
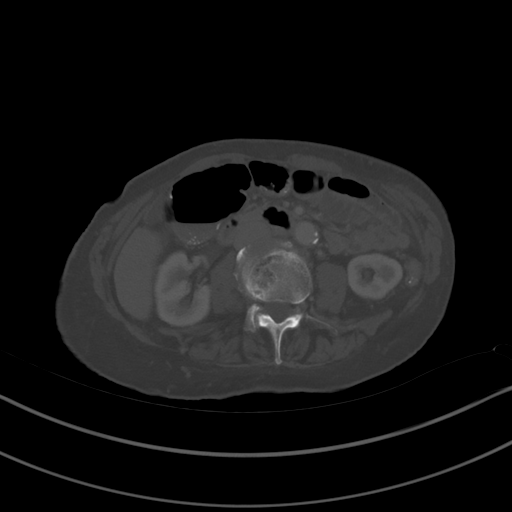
[im 49/76  soft-tissue]
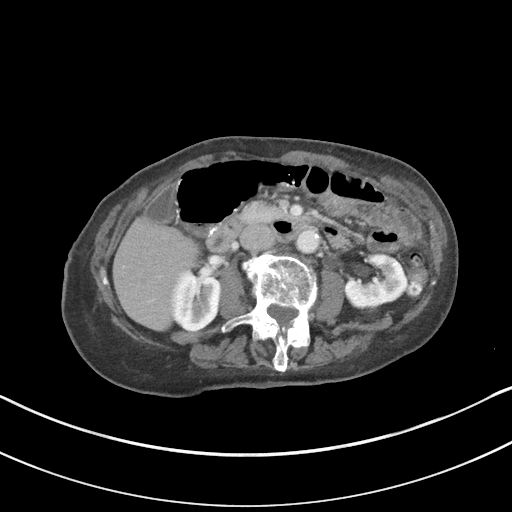
[im 57/76  soft-tissue]
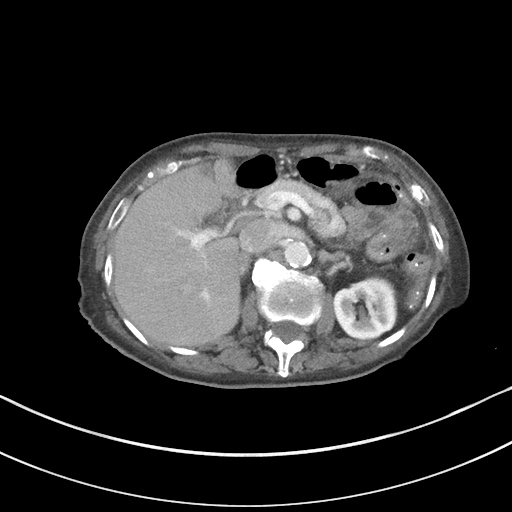
[im 61/76  soft-tissue]
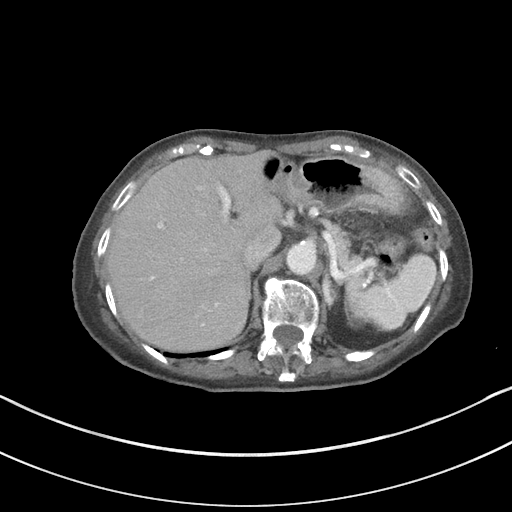
[im 64/76  soft-tissue]
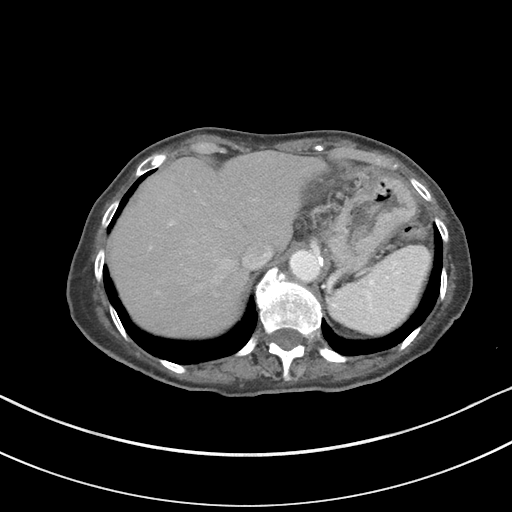
[im 72/76  soft-tissue]
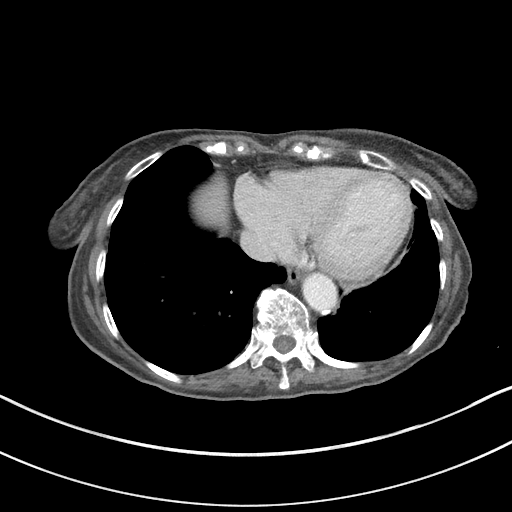

[Series 5: coronal st · coronal · 0.68mm/px · 3 of 61 slices shown]
[im 21/61  soft-tissue]
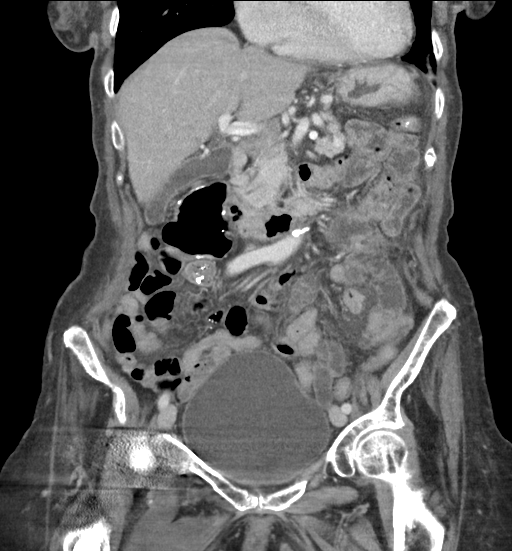
[im 27/61  soft-tissue]
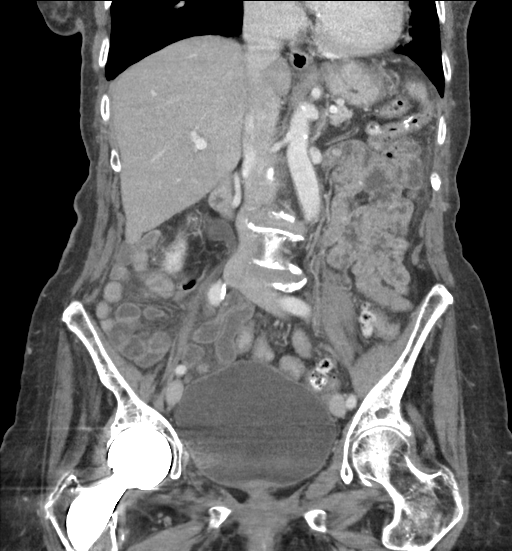
[im 34/61  soft-tissue]
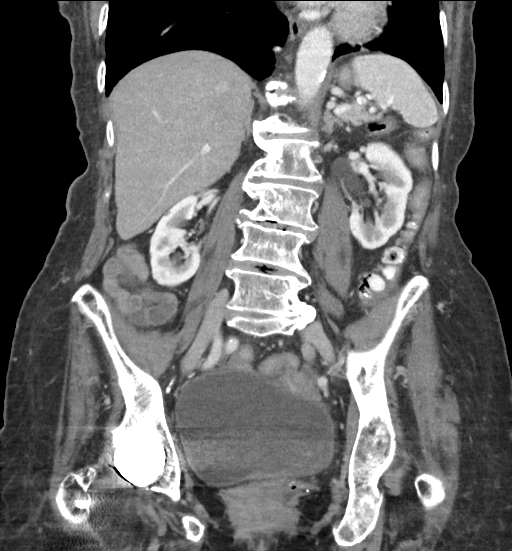

[17 of 46 positions shown; findings below may reference images not displayed]

FINDINGS: Lower chest: Lung bases demonstrate no acute consolidation or
pleural effusion. Atelectasis or scar at the left base. Mild
cardiomegaly.

Hepatobiliary: Septated cystic density at the dome of the liver. No
calcified gallstones or biliary dilatation.

Pancreas: Unremarkable. No pancreatic ductal dilatation or
surrounding inflammatory changes.

Spleen: Normal in size without focal abnormality.

Adrenals/Urinary Tract: Adrenal glands are within normal limits. No
hydronephrosis. Cysts within the kidneys. Subcentimeter
hypodensities too small to further characterize. The bladder is
normal

Stomach/Bowel: Stomach is nonenlarged. No dilated small bowel.
Postsurgical changes of the right colon. No colon wall thickening.
Sigmoid colon diverticula without acute inflammation

Vascular/Lymphatic: Moderate aortic atherosclerosis. No aneurysmal
dilatation. No significantly enlarged lymph nodes.

Reproductive: Uterus and bilateral adnexa are unremarkable.

Other: Negative for free air or free fluid.

Musculoskeletal: Degenerative changes of the spine. Trace
retrolisthesis of L3 on L4. Status post right hip replacement with
normal alignment
IMPRESSION: 1. Postsurgical changes of the right colon. No evidence for a bowel
obstruction or bowel wall thickening.
2. Sigmoid colon diverticula without acute inflammation

## 2021-10-05 ENCOUNTER — Other Ambulatory Visit: Payer: Self-pay

## 2021-10-05 ENCOUNTER — Emergency Department (HOSPITAL_COMMUNITY): Payer: Medicare Other

## 2021-10-05 ENCOUNTER — Inpatient Hospital Stay (HOSPITAL_COMMUNITY)
Admission: EM | Admit: 2021-10-05 | Discharge: 2021-10-11 | DRG: 193 | Disposition: A | Discharge status: Skilled Nursing Facility | Payer: Medicare Other | Source: Skilled Nursing Facility | Attending: Internal Medicine | Admitting: Internal Medicine

## 2021-10-05 ENCOUNTER — Encounter (HOSPITAL_COMMUNITY): Payer: Self-pay

## 2021-10-05 DIAGNOSIS — G9341 Metabolic encephalopathy: Secondary | ICD-10-CM | POA: Diagnosis present

## 2021-10-05 DIAGNOSIS — R0902 Hypoxemia: Secondary | ICD-10-CM

## 2021-10-05 DIAGNOSIS — Z885 Allergy status to narcotic agent status: Secondary | ICD-10-CM

## 2021-10-05 DIAGNOSIS — F0394 Unspecified dementia, unspecified severity, with anxiety: Secondary | ICD-10-CM | POA: Diagnosis present

## 2021-10-05 DIAGNOSIS — G250 Essential tremor: Secondary | ICD-10-CM | POA: Diagnosis present

## 2021-10-05 DIAGNOSIS — I10 Essential (primary) hypertension: Secondary | ICD-10-CM | POA: Diagnosis present

## 2021-10-05 DIAGNOSIS — F32A Depression, unspecified: Secondary | ICD-10-CM | POA: Diagnosis present

## 2021-10-05 DIAGNOSIS — N19 Unspecified kidney failure: Secondary | ICD-10-CM | POA: Diagnosis present

## 2021-10-05 DIAGNOSIS — Z7982 Long term (current) use of aspirin: Secondary | ICD-10-CM

## 2021-10-05 DIAGNOSIS — J189 Pneumonia, unspecified organism: Secondary | ICD-10-CM | POA: Diagnosis not present

## 2021-10-05 DIAGNOSIS — Z79899 Other long term (current) drug therapy: Secondary | ICD-10-CM

## 2021-10-05 DIAGNOSIS — Z66 Do not resuscitate: Secondary | ICD-10-CM | POA: Diagnosis present

## 2021-10-05 DIAGNOSIS — R251 Tremor, unspecified: Secondary | ICD-10-CM | POA: Diagnosis present

## 2021-10-05 DIAGNOSIS — I5032 Chronic diastolic (congestive) heart failure: Secondary | ICD-10-CM | POA: Diagnosis present

## 2021-10-05 DIAGNOSIS — N390 Urinary tract infection, site not specified: Secondary | ICD-10-CM | POA: Diagnosis present

## 2021-10-05 DIAGNOSIS — M1990 Unspecified osteoarthritis, unspecified site: Secondary | ICD-10-CM | POA: Diagnosis present

## 2021-10-05 DIAGNOSIS — K219 Gastro-esophageal reflux disease without esophagitis: Secondary | ICD-10-CM | POA: Diagnosis present

## 2021-10-05 DIAGNOSIS — Z20822 Contact with and (suspected) exposure to covid-19: Secondary | ICD-10-CM | POA: Diagnosis present

## 2021-10-05 DIAGNOSIS — E7800 Pure hypercholesterolemia, unspecified: Secondary | ICD-10-CM | POA: Diagnosis present

## 2021-10-05 DIAGNOSIS — Z85038 Personal history of other malignant neoplasm of large intestine: Secondary | ICD-10-CM

## 2021-10-05 DIAGNOSIS — J40 Bronchitis, not specified as acute or chronic: Secondary | ICD-10-CM | POA: Diagnosis not present

## 2021-10-05 DIAGNOSIS — I110 Hypertensive heart disease with heart failure: Secondary | ICD-10-CM | POA: Diagnosis present

## 2021-10-05 DIAGNOSIS — Z85828 Personal history of other malignant neoplasm of skin: Secondary | ICD-10-CM

## 2021-10-05 DIAGNOSIS — Z8661 Personal history of infections of the central nervous system: Secondary | ICD-10-CM

## 2021-10-05 DIAGNOSIS — J9601 Acute respiratory failure with hypoxia: Secondary | ICD-10-CM | POA: Diagnosis present

## 2021-10-05 DIAGNOSIS — Z923 Personal history of irradiation: Secondary | ICD-10-CM

## 2021-10-05 DIAGNOSIS — F0393 Unspecified dementia, unspecified severity, with mood disturbance: Secondary | ICD-10-CM | POA: Diagnosis present

## 2021-10-05 DIAGNOSIS — F419 Anxiety disorder, unspecified: Secondary | ICD-10-CM | POA: Diagnosis present

## 2021-10-05 DIAGNOSIS — R7989 Other specified abnormal findings of blood chemistry: Secondary | ICD-10-CM | POA: Diagnosis present

## 2021-10-05 LAB — CBC WITH DIFFERENTIAL/PLATELET
Abs Immature Granulocytes: 0.07 10*3/uL (ref 0.00–0.07)
Basophils Absolute: 0 10*3/uL (ref 0.0–0.1)
Basophils Relative: 0 %
Eosinophils Absolute: 0.1 10*3/uL (ref 0.0–0.5)
Eosinophils Relative: 1 %
HCT: 43.4 % (ref 36.0–46.0)
Hemoglobin: 14.2 g/dL (ref 12.0–15.0)
Immature Granulocytes: 1 %
Lymphocytes Relative: 17 %
Lymphs Abs: 1.4 10*3/uL (ref 0.7–4.0)
MCH: 32.1 pg (ref 26.0–34.0)
MCHC: 32.7 g/dL (ref 30.0–36.0)
MCV: 98.2 fL (ref 80.0–100.0)
Monocytes Absolute: 0.9 10*3/uL (ref 0.1–1.0)
Monocytes Relative: 11 %
Neutro Abs: 5.8 10*3/uL (ref 1.7–7.7)
Neutrophils Relative %: 70 %
Platelets: 251 10*3/uL (ref 150–400)
RBC: 4.42 MIL/uL (ref 3.87–5.11)
RDW: 13.1 % (ref 11.5–15.5)
WBC: 8.3 10*3/uL (ref 4.0–10.5)
nRBC: 0 % (ref 0.0–0.2)

## 2021-10-05 LAB — BASIC METABOLIC PANEL
Anion gap: 6 (ref 5–15)
BUN: 22 mg/dL (ref 8–23)
CO2: 27 mmol/L (ref 22–32)
Calcium: 8.9 mg/dL (ref 8.9–10.3)
Chloride: 105 mmol/L (ref 98–111)
Creatinine, Ser: 0.81 mg/dL (ref 0.44–1.00)
GFR, Estimated: >60 mL/min (ref >60)
Glucose, Bld: 115 mg/dL — ABNORMAL HIGH (ref 70–99)
Potassium: 4.5 mmol/L (ref 3.5–5.1)
Sodium: 138 mmol/L (ref 135–145)

## 2021-10-05 LAB — RESP PANEL BY RT-PCR (FLU A&B, COVID) ARPGX2
Influenza A by PCR: NEGATIVE
Influenza B by PCR: NEGATIVE
SARS Coronavirus 2 by RT PCR: NEGATIVE

## 2021-10-05 MED ORDER — SODIUM CHLORIDE 0.9 % IV SOLN
500.0000 mg | Freq: Once | INTRAVENOUS | Status: AC
  Administered 2021-10-05: 500 mg via INTRAVENOUS
  Filled 2021-10-05: qty 5

## 2021-10-05 MED ORDER — SODIUM CHLORIDE 0.9 % IV SOLN
1.0000 g | Freq: Once | INTRAVENOUS | Status: AC
  Administered 2021-10-05: 1 g via INTRAVENOUS
  Filled 2021-10-05: qty 10

## 2021-10-05 MED ORDER — IOHEXOL 350 MG/ML SOLN
75.0000 mL | Freq: Once | INTRAVENOUS | Status: AC | PRN
  Administered 2021-10-05: 75 mL via INTRAVENOUS

## 2021-10-05 MED ORDER — SODIUM CHLORIDE 0.9 % IV BOLUS
1000.0000 mL | Freq: Once | INTRAVENOUS | Status: AC
  Administered 2021-10-05: 1000 mL via INTRAVENOUS

## 2021-10-05 NOTE — ED Provider Notes (Signed)
[facility] DEPT Provider Note   CSN: [id number] Arrival date & time: 10/05/21  2135     History  Chief Complaint  Patient presents with   Cough    Kayla Pace is a 88 y.o. female.  HPI She presents for evaluation of cough for 2 weeks, being evaluated treated by her PCP.  She was started on prednisone and given a cough suppressant.  Apparently last week she was started on oxygen by nasal cannula at 2 L for "low oxygen."  Today she had laboratory testing done which showed elevated D-dimer.  She apparently had Doppler imaging of the legs today as well as a chest x-ray however those results were not available.  Patient does not have recurrent pulmonary disease.    Home Medications Prior to Admission medications   Medication Sig Start Date End Date Taking? Authorizing Provider  acetaminophen (TYLENOL) 500 MG tablet Take 500 mg by mouth every 6 (six) hours as needed for moderate pain.    [provider]  Ascorbic Acid (VITAMIN C) 1000 MG tablet Take 1 tablet by mouth daily.    [provider]  aspirin 81 MG tablet Take 1 tablet by mouth daily. 02/09/08   [provider]  Cholecalciferol (VITAMIN D PO) Take 1 tablet by mouth daily.    [provider]  citalopram (CELEXA) 20 MG tablet Take 10 mg by mouth 2 (two) times daily.  05/12/18   [provider]  clotrimazole (LOTRIMIN) 1 % cream Apply 1 application topically daily as needed (open sores.).  03/19/17   [provider]  digoxin (LANOXIN) 0.125 MG tablet Take 2 tablets by mouth daily.  03/25/12   [provider]  docusate sodium (COLACE) 100 MG capsule Take 1 capsule (100 mg total) by mouth 2 (two) times daily. Patient not taking: Reported on 07/22/2018 05/29/18   Bonnielee Haff, MD  Ibuprofen-diphenhydrAMINE HCl (ADVIL PM) 200-25 MG CAPS Take 2 capsules by mouth at bedtime.    [provider]  memantine (NAMENDA) 5 MG tablet Take 5 mg by  mouth 4 (four) times daily. 03/17/17   [provider]  Multiple Vitamins-Minerals (CENTRUM SILVER ADULT 50+) TABS Take 1 tablet by mouth daily.    [provider]  primidone (MYSOLINE) 250 MG tablet Take 1 tablet by mouth 2 (two) times daily. 02/08/13   [provider]  senna (SENOKOT) 8.6 MG TABS tablet Take 1 tablet (8.6 mg total) by mouth daily. Patient taking differently: Take 1 tablet by mouth at bedtime.  05/29/18   Bonnielee Haff, MD  simvastatin (ZOCOR) 10 MG tablet Take 1 tablet by mouth at bedtime. 03/25/12   [provider]  Tetrahydrozoline HCl (VISINE OP) Apply 1 drop to eye daily as needed (dry eyes).    [provider]      Allergies    Codeine and Morphine    Review of Systems   Review of Systems  Physical Exam Updated Vital Signs BP 134/90 (BP Location: Left Arm)   Pulse 80   Temp 97.7 F (36.5 C) (Oral)   Resp (!) 27   Ht _0  (1.499 m)   Wt 48.5 kg   SpO2 90%   BMI 21.60 kg/m  Physical Exam Vitals and nursing note reviewed.  Constitutional:      Appearance: She is well-developed. She is not ill-appearing.  HENT:     Head: Normocephalic and atraumatic.     Right Ear: External ear normal.  Left Ear: External ear normal.     Nose: No congestion.     Mouth/Throat:     Pharynx: No oropharyngeal exudate or posterior oropharyngeal erythema.  Eyes:     Conjunctiva/sclera: Conjunctivae normal.     Pupils: Pupils are equal, round, and reactive to light.  Neck:     Trachea: Phonation normal.  Cardiovascular:     Rate and Rhythm: Normal rate and regular rhythm.     Heart sounds: Normal heart sounds.  Pulmonary:     Effort: Pulmonary effort is normal. No respiratory distress.     Breath sounds: No stridor. Rhonchi present. No wheezing.     Comments: Nonproductive congested cough noted during exam.  Trial off oxygen, oxygen saturation dropped to 89% within 5 minutes.  Patient did not experience respiratory  distress with this maneuver. Abdominal:     General: There is no distension.     Palpations: Abdomen is soft.     Tenderness: There is no abdominal tenderness.  Musculoskeletal:        General: Normal range of motion.     Cervical back: Normal range of motion and neck supple.     Right lower leg: No edema.     Left lower leg: No edema.  Skin:    General: Skin is warm and dry.     Coloration: Skin is not jaundiced or pale.  Neurological:     Mental Status: She is alert and oriented to person, place, and time.     Cranial Nerves: No cranial nerve deficit.     Sensory: No sensory deficit.     Motor: No abnormal muscle tone.     Coordination: Coordination normal.  Psychiatric:        Mood and Affect: Mood normal.        Behavior: Behavior normal.    ED Results / Procedures / Treatments   Labs (all labs ordered are listed, but only abnormal results are displayed) Labs Reviewed  BASIC METABOLIC PANEL - Abnormal; Notable for the following components:      Result Value   Glucose, Bld 115 (*)    All other components within normal limits  RESP PANEL BY RT-PCR (FLU A&B, COVID) ARPGX2  CULTURE, BLOOD (ROUTINE X 2)  CULTURE, BLOOD (ROUTINE X 2)  CBC WITH DIFFERENTIAL/PLATELET  LACTIC ACID, PLASMA  LACTIC ACID, PLASMA    EKG None  Radiology DG Chest 2 View  Result Date: 10/05/2021 CLINICAL DATA:  Cough EXAM: CHEST - 2 VIEW COMPARISON:  None Available. FINDINGS: Patchy airspace disease at the left base. Mild right infrahilar and lower lung opacity. No pleural effusion. Suspect hiatal hernia. Normal cardiac size. Aortic atherosclerosis. IMPRESSION: 1. Patchy airspace disease in the right infrahilar lung and left base, atelectasis versus minimal pneumonia 2. Suspect hiatal hernia Electronically Signed   By: Donavan Foil M.D.   On: 10/05/2021 22:19    Procedures Procedures    Medications Ordered in ED Medications  sodium chloride 0.9 % bolus 1,000 mL (has no administration in time  range)  cefTRIAXone (ROCEPHIN) 1 g in sodium chloride 0.9 % 100 mL IVPB (1 g Intravenous New Bag/Given 10/05/21 2310)  azithromycin (ZITHROMAX) 500 mg in sodium chloride 0.9 % 250 mL IVPB (has no administration in time range)    ED Course/ Medical Decision Making/ A&P                           Medical Decision Making Patient  with cough, not improving with symptomatic treatment including prednisone and cough medicine.  Concern for pneumonia or PE, led patient's physician to send her here for evaluation.  She is elderly.  No chronic lung disease.  She had COVID 3 years ago.  She has been fully vaccinated.  Problems Addressed: Bronchitis: complicated acute illness or injury Community acquired pneumonia of right middle lobe of lung: acute illness or injury with systemic symptoms that poses a threat to life or bodily functions Hypoxia: acute illness or injury that poses a threat to life or bodily functions  Amount and/or Complexity of Data Reviewed Independent Historian: caregiver    Details: Daughter at bedside gives history External Data Reviewed: labs.    Details: Labs with patient, D-dimer elevated. Labs: ordered.    Details: CBC, be met, lactate, blood cultures, viral panel-initial findings with elevated glucose.  Viral panel negative Radiology: ordered and independent interpretation performed.    Details: Chest x-ray-right infrahilar patchy infiltrate with atelectasis left base.  CT scan ordered as angiogram to rule out PE.  Patchy left lower lung infiltrate and abnormal bronchi and peribronchial tissue in the right lower lobe. ECG/medicine tests: ordered and independent interpretation performed.    Details: Cardiac monitor-normal sinus rhythm Discussion of management or test interpretation with external provider(s): Consultation hospitalist to arrange for admission.  Risk Prescription drug management. Decision regarding hospitalization. Risk Details: Patient with ongoing cough,  nonproductive but wet sounding.  Low oxygen, concerning for either pneumonia versus PE.  Patient treated with IV fluids, oxygen, empiric antibiotic, Rocephin and azithromycin.  Screening for sepsis parameters, to assess severity of illness.  Patient may be a candidate for management at her facility since she is currently receiving oxygen and nursing care there.  CT scan consistent with left-sided pneumonia and right-sided bronchitis.  No evidence for PE.  Trial off oxygen, she desaturated.  No respiratory distress.  Hemodynamically stable with mild tachypnea during evaluation.  Due to potentially by lateral lung infection, with hypoxia, hospitalist consulted for admission.  Doubt severe sepsis, or significant metabolic instability.  COVID-negative.  Influenza negative.  Critical Care Total time providing critical care: 45 minutes          Final Clinical Impression(s) / ED Diagnoses Final diagnoses:  Community acquired pneumonia of right middle lobe of lung    Rx / DC Orders ED Discharge Orders     None         Daleen Bo, MD 10/06/21 [phone]

## 2021-10-05 NOTE — ED Triage Notes (Signed)
BIB EMS from [facility] facility for a cough, started 3 weeks ago and has been getting worse

## 2021-10-05 NOTE — ED Provider Notes (Incomplete)
[facility] DEPT Provider Note   CSN: [id number] Arrival date & time: 10/05/21  2135     History {Add pertinent medical, surgical, social history, OB history to HPI:1} Chief Complaint  Patient presents with  . Cough    Kayla Pace is a 88 y.o. female.  HPI She presents for evaluation of cough for 2 weeks, being evaluated treated by her PCP.  She was started on prednisone and given a cough suppressant.  Apparently last week she was started on oxygen by nasal cannula at 2 L for "low oxygen."  Today she had laboratory testing done which showed elevated D-dimer.  She apparently had Doppler imaging of the legs today as well as a chest x-ray however those results were not available.  Patient does not have recurrent pulmonary disease.    Home Medications Prior to Admission medications   Medication Sig Start Date End Date Taking? Authorizing Provider  acetaminophen (TYLENOL) 500 MG tablet Take 500 mg by mouth every 6 (six) hours as needed for moderate pain.    [provider]  Ascorbic Acid (VITAMIN C) 1000 MG tablet Take 1 tablet by mouth daily.    [provider]  aspirin 81 MG tablet Take 1 tablet by mouth daily. 02/09/08   [provider]  Cholecalciferol (VITAMIN D PO) Take 1 tablet by mouth daily.    [provider]  citalopram (CELEXA) 20 MG tablet Take 10 mg by mouth 2 (two) times daily.  05/12/18   [provider]  clotrimazole (LOTRIMIN) 1 % cream Apply 1 application topically daily as needed (open sores.).  03/19/17   [provider]  digoxin (LANOXIN) 0.125 MG tablet Take 2 tablets by mouth daily.  03/25/12   [provider]  docusate sodium (COLACE) 100 MG capsule Take 1 capsule (100 mg total) by mouth 2 (two) times daily. Patient not taking: Reported on 07/22/2018 05/29/18   Bonnielee Haff, MD  Ibuprofen-diphenhydrAMINE HCl (ADVIL PM) 200-25 MG CAPS Take 2 capsules by mouth at bedtime.     [provider]  memantine (NAMENDA) 5 MG tablet Take 5 mg by mouth 4 (four) times daily. 03/17/17   [provider]  Multiple Vitamins-Minerals (CENTRUM SILVER ADULT 50+) TABS Take 1 tablet by mouth daily.    [provider]  primidone (MYSOLINE) 250 MG tablet Take 1 tablet by mouth 2 (two) times daily. 02/08/13   [provider]  senna (SENOKOT) 8.6 MG TABS tablet Take 1 tablet (8.6 mg total) by mouth daily. Patient taking differently: Take 1 tablet by mouth at bedtime.  05/29/18   Bonnielee Haff, MD  simvastatin (ZOCOR) 10 MG tablet Take 1 tablet by mouth at bedtime. 03/25/12   [provider]  Tetrahydrozoline HCl (VISINE OP) Apply 1 drop to eye daily as needed (dry eyes).    [provider]      Allergies    Codeine and Morphine    Review of Systems   Review of Systems  Physical Exam Updated Vital Signs BP 134/90 (BP Location: Left Arm)   Pulse 80   Temp 97.7 F (36.5 C) (Oral)   Resp (!) 27   Ht '4\' 11"'  (1.499 m)   Wt 48.5 kg   SpO2 90%   BMI 21.60 kg/m  Physical Exam Vitals and nursing note reviewed.  Constitutional:      Appearance: She is well-developed. She is not ill-appearing.  HENT:     Head: Normocephalic and atraumatic.  Right Ear: External ear normal.     Left Ear: External ear normal.     Nose: No congestion.     Mouth/Throat:     Pharynx: No oropharyngeal exudate or posterior oropharyngeal erythema.  Eyes:     Conjunctiva/sclera: Conjunctivae normal.     Pupils: Pupils are equal, round, and reactive to light.  Neck:     Trachea: Phonation normal.  Cardiovascular:     Rate and Rhythm: Normal rate and regular rhythm.     Heart sounds: Normal heart sounds.  Pulmonary:     Effort: Pulmonary effort is normal. No respiratory distress.     Breath sounds: No stridor. Rhonchi present. No wheezing.     Comments: Nonproductive congested cough noted during exam.  Trial off oxygen, oxygen saturation dropped  to 89% within 5 minutes.  Patient did not experience respiratory distress with this maneuver. Abdominal:     General: There is no distension.     Palpations: Abdomen is soft.     Tenderness: There is no abdominal tenderness.  Musculoskeletal:        General: Normal range of motion.     Cervical back: Normal range of motion and neck supple.     Right lower leg: No edema.     Left lower leg: No edema.  Skin:    General: Skin is warm and dry.     Coloration: Skin is not jaundiced or pale.  Neurological:     Mental Status: She is alert and oriented to person, place, and time.     Cranial Nerves: No cranial nerve deficit.     Sensory: No sensory deficit.     Motor: No abnormal muscle tone.     Coordination: Coordination normal.  Psychiatric:        Mood and Affect: Mood normal.        Behavior: Behavior normal.    ED Results / Procedures / Treatments   Labs (all labs ordered are listed, but only abnormal results are displayed) Labs Reviewed  BASIC METABOLIC PANEL - Abnormal; Notable for the following components:      Result Value   Glucose, Bld 115 (*)    All other components within normal limits  RESP PANEL BY RT-PCR (FLU A&B, COVID) ARPGX2  CULTURE, BLOOD (ROUTINE X 2)  CULTURE, BLOOD (ROUTINE X 2)  CBC WITH DIFFERENTIAL/PLATELET  LACTIC ACID, PLASMA  LACTIC ACID, PLASMA    EKG None  Radiology DG Chest 2 View  Result Date: 10/05/2021 CLINICAL DATA:  Cough EXAM: CHEST - 2 VIEW COMPARISON:  None Available. FINDINGS: Patchy airspace disease at the left base. Mild right infrahilar and lower lung opacity. No pleural effusion. Suspect hiatal hernia. Normal cardiac size. Aortic atherosclerosis. IMPRESSION: 1. Patchy airspace disease in the right infrahilar lung and left base, atelectasis versus minimal pneumonia 2. Suspect hiatal hernia Electronically Signed   By: Donavan Foil M.D.   On: 10/05/2021 22:19    Procedures Procedures  {Document cardiac monitor, telemetry assessment  procedure when appropriate:1}  Medications Ordered in ED Medications  sodium chloride 0.9 % bolus 1,000 mL (has no administration in time range)  cefTRIAXone (ROCEPHIN) 1 g in sodium chloride 0.9 % 100 mL IVPB (1 g Intravenous New Bag/Given 10/05/21 2310)  azithromycin (ZITHROMAX) 500 mg in sodium chloride 0.9 % 250 mL IVPB (has no administration in time range)    ED Course/ Medical Decision Making/ A&P  Medical Decision Making Patient with cough, not improving with symptomatic treatment including prednisone and cough medicine.  Concern for pneumonia or PE, led patient's physician to send her here for evaluation.  She is elderly.  No chronic lung disease.  She had COVID 3 years ago.  She has been fully vaccinated.  Problems Addressed: Bronchitis: complicated acute illness or injury Community acquired pneumonia of right middle lobe of lung: acute illness or injury with systemic symptoms that poses a threat to life or bodily functions Hypoxia: acute illness or injury that poses a threat to life or bodily functions  Amount and/or Complexity of Data Reviewed Independent Historian: caregiver    Details: Daughter at bedside gives history External Data Reviewed: labs.    Details: Labs with patient, D-dimer elevated. Labs: ordered.    Details: CBC, be met, lactate, blood cultures, viral panel-initial findings with elevated glucose.  Viral panel negative Radiology: ordered and independent interpretation performed.    Details: Chest x-ray-right infrahilar patchy infiltrate with atelectasis left base.  CT scan ordered as angiogram to rule out PE.  Patchy left lower lung infiltrate and abnormal bronchi and peribronchial tissue in the right lower lobe. ECG/medicine tests: ordered and independent interpretation performed.    Details: Cardiac monitor-normal sinus rhythm Discussion of management or test interpretation with external provider(s): Consultation hospitalist to  arrange for admission.  Risk Prescription drug management. Decision regarding hospitalization. Risk Details: Patient with ongoing cough, nonproductive but wet sounding.  Low oxygen, concerning for either pneumonia versus PE.  Patient treated with IV fluids, oxygen, empiric antibiotic, Rocephin and azithromycin.  Screening for sepsis parameters, to assess severity of illness.  Patient may be a candidate for management at her facility since she is currently receiving oxygen and nursing care there.  CT scan consistent with left-sided pneumonia and right-sided bronchitis.  No evidence for PE.  Trial off oxygen, she desaturated.  No respiratory distress.  Hemodynamically stable with mild tachypnea during evaluation.  Due to potentially by lateral lung infection, with hypoxia, hospitalist consulted for admission.  Doubt severe sepsis, or significant metabolic instability.  COVID-negative.  Influenza negative.  Critical Care Total time providing critical care: 45 minutes    {Document critical care time when appropriate:1} {Document review of labs and clinical decision tools ie heart score, Chads2Vasc2 etc:1}  {Document your independent review of radiology images, and any outside records:1} {Document your discussion with family members, caretakers, and with consultants:1} {Document social determinants of health affecting pt's care:1} {Document your decision making why or why not admission, treatments were needed:1} Final Clinical Impression(s) / ED Diagnoses Final diagnoses:  Community acquired pneumonia of right middle lobe of lung    Rx / DC Orders ED Discharge Orders     None

## 2021-10-06 ENCOUNTER — Encounter (HOSPITAL_COMMUNITY): Payer: Self-pay | Admitting: Internal Medicine

## 2021-10-06 DIAGNOSIS — Z923 Personal history of irradiation: Secondary | ICD-10-CM | POA: Diagnosis not present

## 2021-10-06 DIAGNOSIS — F32A Depression, unspecified: Secondary | ICD-10-CM | POA: Diagnosis not present

## 2021-10-06 DIAGNOSIS — Z85038 Personal history of other malignant neoplasm of large intestine: Secondary | ICD-10-CM | POA: Diagnosis not present

## 2021-10-06 DIAGNOSIS — J40 Bronchitis, not specified as acute or chronic: Secondary | ICD-10-CM | POA: Diagnosis present

## 2021-10-06 DIAGNOSIS — Z66 Do not resuscitate: Secondary | ICD-10-CM | POA: Diagnosis present

## 2021-10-06 DIAGNOSIS — R251 Tremor, unspecified: Secondary | ICD-10-CM

## 2021-10-06 DIAGNOSIS — N19 Unspecified kidney failure: Secondary | ICD-10-CM | POA: Diagnosis not present

## 2021-10-06 DIAGNOSIS — Z7982 Long term (current) use of aspirin: Secondary | ICD-10-CM | POA: Diagnosis not present

## 2021-10-06 DIAGNOSIS — Z20822 Contact with and (suspected) exposure to covid-19: Secondary | ICD-10-CM | POA: Diagnosis present

## 2021-10-06 DIAGNOSIS — M1990 Unspecified osteoarthritis, unspecified site: Secondary | ICD-10-CM | POA: Diagnosis present

## 2021-10-06 DIAGNOSIS — Z885 Allergy status to narcotic agent status: Secondary | ICD-10-CM | POA: Diagnosis not present

## 2021-10-06 DIAGNOSIS — I10 Essential (primary) hypertension: Secondary | ICD-10-CM

## 2021-10-06 DIAGNOSIS — F0394 Unspecified dementia, unspecified severity, with anxiety: Secondary | ICD-10-CM | POA: Diagnosis present

## 2021-10-06 DIAGNOSIS — I5032 Chronic diastolic (congestive) heart failure: Secondary | ICD-10-CM

## 2021-10-06 DIAGNOSIS — F419 Anxiety disorder, unspecified: Secondary | ICD-10-CM

## 2021-10-06 DIAGNOSIS — G9341 Metabolic encephalopathy: Secondary | ICD-10-CM | POA: Diagnosis present

## 2021-10-06 DIAGNOSIS — R7989 Other specified abnormal findings of blood chemistry: Secondary | ICD-10-CM | POA: Diagnosis present

## 2021-10-06 DIAGNOSIS — F0393 Unspecified dementia, unspecified severity, with mood disturbance: Secondary | ICD-10-CM | POA: Diagnosis present

## 2021-10-06 DIAGNOSIS — Z85828 Personal history of other malignant neoplasm of skin: Secondary | ICD-10-CM | POA: Diagnosis not present

## 2021-10-06 DIAGNOSIS — Z79899 Other long term (current) drug therapy: Secondary | ICD-10-CM | POA: Diagnosis not present

## 2021-10-06 DIAGNOSIS — J189 Pneumonia, unspecified organism: Secondary | ICD-10-CM | POA: Diagnosis present

## 2021-10-06 DIAGNOSIS — G250 Essential tremor: Secondary | ICD-10-CM | POA: Diagnosis present

## 2021-10-06 DIAGNOSIS — E7800 Pure hypercholesterolemia, unspecified: Secondary | ICD-10-CM | POA: Diagnosis present

## 2021-10-06 DIAGNOSIS — K219 Gastro-esophageal reflux disease without esophagitis: Secondary | ICD-10-CM

## 2021-10-06 DIAGNOSIS — J9601 Acute respiratory failure with hypoxia: Secondary | ICD-10-CM | POA: Diagnosis present

## 2021-10-06 DIAGNOSIS — N390 Urinary tract infection, site not specified: Secondary | ICD-10-CM | POA: Diagnosis present

## 2021-10-06 DIAGNOSIS — I110 Hypertensive heart disease with heart failure: Secondary | ICD-10-CM | POA: Diagnosis present

## 2021-10-06 DIAGNOSIS — Z8661 Personal history of infections of the central nervous system: Secondary | ICD-10-CM | POA: Diagnosis not present

## 2021-10-06 LAB — PROCALCITONIN: Procalcitonin: <0.1 ng/mL

## 2021-10-06 LAB — URINALYSIS, COMPLETE (UACMP) WITH MICROSCOPIC
Bilirubin Urine: NEGATIVE
Glucose, UA: NEGATIVE mg/dL
Hgb urine dipstick: NEGATIVE
Ketones, ur: 5 mg/dL — AB
Nitrite: POSITIVE — AB
Protein, ur: NEGATIVE mg/dL
Specific Gravity, Urine: >1.046 — ABNORMAL HIGH (ref 1.005–1.030)
pH: 5 (ref 5.0–8.0)

## 2021-10-06 LAB — CBC WITH DIFFERENTIAL/PLATELET
Abs Immature Granulocytes: 0.05 10*3/uL (ref 0.00–0.07)
Basophils Absolute: 0.1 10*3/uL (ref 0.0–0.1)
Basophils Relative: 1 %
Eosinophils Absolute: 0.1 10*3/uL (ref 0.0–0.5)
Eosinophils Relative: 2 %
HCT: 46 % (ref 36.0–46.0)
Hemoglobin: 14.6 g/dL (ref 12.0–15.0)
Immature Granulocytes: 1 %
Lymphocytes Relative: 25 %
Lymphs Abs: 2.3 10*3/uL (ref 0.7–4.0)
MCH: 31.7 pg (ref 26.0–34.0)
MCHC: 31.7 g/dL (ref 30.0–36.0)
MCV: 100 fL (ref 80.0–100.0)
Monocytes Absolute: 1 10*3/uL (ref 0.1–1.0)
Monocytes Relative: 12 %
Neutro Abs: 5.4 10*3/uL (ref 1.7–7.7)
Neutrophils Relative %: 59 %
Platelets: 248 10*3/uL (ref 150–400)
RBC: 4.6 MIL/uL (ref 3.87–5.11)
RDW: 13.2 % (ref 11.5–15.5)
WBC: 9 10*3/uL (ref 4.0–10.5)
nRBC: 0 % (ref 0.0–0.2)

## 2021-10-06 LAB — COMPREHENSIVE METABOLIC PANEL
ALT: 58 U/L — ABNORMAL HIGH (ref 0–44)
AST: 26 U/L (ref 15–41)
Albumin: 3.2 g/dL — ABNORMAL LOW (ref 3.5–5.0)
Alkaline Phosphatase: 84 U/L (ref 38–126)
Anion gap: 7 (ref 5–15)
BUN: 18 mg/dL (ref 8–23)
CO2: 27 mmol/L (ref 22–32)
Calcium: 8.8 mg/dL — ABNORMAL LOW (ref 8.9–10.3)
Chloride: 104 mmol/L (ref 98–111)
Creatinine, Ser: 0.68 mg/dL (ref 0.44–1.00)
GFR, Estimated: >60 mL/min (ref >60)
Glucose, Bld: 98 mg/dL (ref 70–99)
Potassium: 4.2 mmol/L (ref 3.5–5.1)
Sodium: 138 mmol/L (ref 135–145)
Total Bilirubin: 0.6 mg/dL (ref 0.3–1.2)
Total Protein: 7.3 g/dL (ref 6.5–8.1)

## 2021-10-06 LAB — MAGNESIUM
Magnesium: 2.3 mg/dL (ref 1.7–2.4)
Magnesium: 2.3 mg/dL (ref 1.7–2.4)

## 2021-10-06 LAB — BRAIN NATRIURETIC PEPTIDE: B Natriuretic Peptide: 55 pg/mL (ref 0.0–100.0)

## 2021-10-06 LAB — PHOSPHORUS: Phosphorus: 3.5 mg/dL (ref 2.5–4.6)

## 2021-10-06 LAB — MRSA NEXT GEN BY PCR, NASAL: MRSA by PCR Next Gen: NOT DETECTED

## 2021-10-06 LAB — LACTIC ACID, PLASMA: Lactic Acid, Venous: 0.7 mmol/L (ref 0.5–1.9)

## 2021-10-06 MED ORDER — RISPERIDONE 0.5 MG PO TABS
0.2500 mg | ORAL_TABLET | Freq: Every day | ORAL | Status: DC
  Administered 2021-10-07 – 2021-10-11 (×5): 0.25 mg via ORAL
  Filled 2021-10-06 (×5): qty 1

## 2021-10-06 MED ORDER — ACETAMINOPHEN 325 MG PO TABS
650.0000 mg | ORAL_TABLET | Freq: Four times a day (QID) | ORAL | Status: DC | PRN
  Administered 2021-10-10: 650 mg via ORAL
  Filled 2021-10-06: qty 2

## 2021-10-06 MED ORDER — AMLODIPINE BESYLATE 5 MG PO TABS
5.0000 mg | ORAL_TABLET | Freq: Every day | ORAL | Status: DC
  Administered 2021-10-07 – 2021-10-11 (×5): 5 mg via ORAL
  Filled 2021-10-06 (×5): qty 1

## 2021-10-06 MED ORDER — SENNA 8.6 MG PO TABS
1.0000 | ORAL_TABLET | Freq: Every day | ORAL | Status: DC
  Administered 2021-10-06 – 2021-10-10 (×5): 8.6 mg via ORAL
  Filled 2021-10-06 (×5): qty 1

## 2021-10-06 MED ORDER — IPRATROPIUM-ALBUTEROL 0.5-2.5 (3) MG/3ML IN SOLN
3.0000 mL | Freq: Four times a day (QID) | RESPIRATORY_TRACT | Status: DC
Start: 2021-10-06 — End: 2021-10-06
  Administered 2021-10-06 (×2): 3 mL via RESPIRATORY_TRACT
  Filled 2021-10-06 (×3): qty 3

## 2021-10-06 MED ORDER — GUAIFENESIN 100 MG/5ML PO LIQD
5.0000 mL | ORAL | Status: DC | PRN
  Administered 2021-10-06 – 2021-10-11 (×10): 5 mL via ORAL
  Filled 2021-10-06 (×10): qty 10

## 2021-10-06 MED ORDER — ORAL CARE MOUTH RINSE
15.0000 mL | Freq: Two times a day (BID) | OROMUCOSAL | Status: DC
  Administered 2021-10-06 – 2021-10-11 (×7): 15 mL via OROMUCOSAL

## 2021-10-06 MED ORDER — MEMANTINE HCL 10 MG PO TABS
10.0000 mg | ORAL_TABLET | Freq: Two times a day (BID) | ORAL | Status: DC
  Administered 2021-10-06 – 2021-10-11 (×10): 10 mg via ORAL
  Filled 2021-10-06 (×10): qty 1

## 2021-10-06 MED ORDER — DEXTROSE-NACL 5-0.9 % IV SOLN: INTRAVENOUS | Status: DC

## 2021-10-06 MED ORDER — PRIMIDONE 250 MG PO TABS
250.0000 mg | ORAL_TABLET | Freq: Two times a day (BID) | ORAL | Status: DC
  Administered 2021-10-06 – 2021-10-11 (×10): 250 mg via ORAL
  Filled 2021-10-06 (×12): qty 1

## 2021-10-06 MED ORDER — ESCITALOPRAM OXALATE 10 MG PO TABS
10.0000 mg | ORAL_TABLET | Freq: Every day | ORAL | Status: DC
  Administered 2021-10-07 – 2021-10-11 (×5): 10 mg via ORAL
  Filled 2021-10-06 (×5): qty 1

## 2021-10-06 MED ORDER — ACETAMINOPHEN 650 MG RE SUPP: 650.0000 mg | Freq: Four times a day (QID) | RECTAL | Status: DC | PRN

## 2021-10-06 MED ORDER — RISPERIDONE 0.5 MG PO TABS
0.5000 mg | ORAL_TABLET | Freq: Every day | ORAL | Status: DC
  Administered 2021-10-06 – 2021-10-10 (×6): 0.5 mg via ORAL
  Filled 2021-10-06 (×6): qty 1

## 2021-10-06 MED ORDER — SODIUM CHLORIDE 0.9 % IV SOLN
500.0000 mg | INTRAVENOUS | Status: DC
  Administered 2021-10-06 – 2021-10-09 (×3): 500 mg via INTRAVENOUS
  Filled 2021-10-06 (×3): qty 5

## 2021-10-06 MED ORDER — ALBUTEROL SULFATE (2.5 MG/3ML) 0.083% IN NEBU
2.5000 mg | INHALATION_SOLUTION | RESPIRATORY_TRACT | Status: DC | PRN
  Administered 2021-10-06 (×2): 2.5 mg via RESPIRATORY_TRACT
  Filled 2021-10-06 (×2): qty 3

## 2021-10-06 MED ORDER — PANTOPRAZOLE SODIUM 40 MG PO TBEC
40.0000 mg | DELAYED_RELEASE_TABLET | Freq: Every day | ORAL | Status: DC
  Administered 2021-10-07 – 2021-10-11 (×5): 40 mg via ORAL
  Filled 2021-10-06 (×5): qty 1

## 2021-10-06 MED ORDER — SODIUM CHLORIDE 0.9 % IV SOLN
1.0000 g | INTRAVENOUS | Status: AC
  Administered 2021-10-06 – 2021-10-10 (×5): 1 g via INTRAVENOUS
  Filled 2021-10-06 (×5): qty 10

## 2021-10-06 MED ORDER — IPRATROPIUM-ALBUTEROL 0.5-2.5 (3) MG/3ML IN SOLN
3.0000 mL | Freq: Three times a day (TID) | RESPIRATORY_TRACT | Status: DC
  Administered 2021-10-07 – 2021-10-11 (×14): 3 mL via RESPIRATORY_TRACT
  Filled 2021-10-06 (×14): qty 3

## 2021-10-06 NOTE — Progress Notes (Signed)
Update:  The patient has failed her nursing bedside swallow screen.  In the context of presenting bilateral pneumonia, will make patient n.p.o. at this time, pending formal swallow evaluation by speech therapy in the morning.  I have also placed an order for the latter.  will continue existing IV antibiotics for community-acquired pneumonia, but with close monitoring of ensuing clinical status as well as monitoring for results of impending swallow evaluation to help guide need for initiation of anaerobic coverage.     Babs Bertin, DO Hospitalist

## 2021-10-06 NOTE — Assessment & Plan Note (Signed)
  #)   Depression: Documented history of such, on Lexapro as an outpatient.  No evidence of QTc prolongation on presenting EKG.  Plan: Continue outpatient Lexapro.

## 2021-10-06 NOTE — Assessment & Plan Note (Signed)
#)   GERD: documented h/o such; on omeprazole as outpatient.  ?  ?Plan: continue home PPI.  ?  ?

## 2021-10-06 NOTE — Plan of Care (Signed)
  Problem: Coping: Goal: Level of anxiety will decrease Outcome: Progressing   Problem: Pain Managment: Goal: General experience of comfort will improve Outcome: Progressing   Problem: Safety: Goal: Ability to remain free from injury will improve Outcome: Progressing   Problem: Skin Integrity: Goal: Risk for impaired skin integrity will decrease Outcome: Progressing   

## 2021-10-06 NOTE — Progress Notes (Signed)
SLP Cancellation Note  Patient Details Name: Sheelah Ritacco MRN: [id number] DOB: 09/06/1921   Cancelled treatment:       Reason Eval/Treat Not Completed: Patient's level of consciousness;Fatigue/lethargy limiting ability to participate SLP attempted once at approximately 0845 and again at 0955 and patient asleep and unable to be adequately aroused. Recommend continue NPO and SLP will check in with patient's RN later this PM.  Sonia Baller, MA, CCC-SLP Speech Therapy

## 2021-10-06 NOTE — Progress Notes (Signed)
  Progress Note   Patient: Kayla Pace IOE:[id number] DOB: 01-27-1922 DOA: 10/05/2021     0 DOS: the patient was seen and examined on 10/06/2021   Brief hospital course: 88 y.o. female with medical history significant for chronic diastolic heart failure, who is admitted to [facility] on 10/05/2021 with community-acquired pneumonia after presenting from SNF to [facility] ED at the direction of her PCP for evaluation of elevated D-dimer checked as an outpatient.   Imaging with findings of L sided PNA, neg for PE  Assessment and Plan: CAP -Imaging reviewed with findings suggestive of L sided PNA -Cont on empiric rocephin and azithro -No leukocytosis. Afebrile -Currently on [facility]. Wean O2 as tolerated  Tremors -Seems stable this AM -On primidone PTA  GERD -Seems stable  HTN -BP stable and controlled at this time -Cont current regimen as tolerated  Chronic diastolic CHF -Appears euvoliemic on exam -Cont to follow I/O's  Pre-renal azotemia -Renal function normal with Cr 0.68 -Cont to follow bmet trends  Acute hypoxemic respiratory failure -Currently on 2LNC -Cont to wean O2 as tolerated  Anxiety -Asleep this AM, seems stable      Subjective: Unable to assess this AM given mentation  Physical Exam: Vitals:   10/06/21 0125 10/06/21 0500 10/06/21 0621 10/06/21 1230  BP: 137/71  131/60 (!) 120/59  Pulse: 74  72 74  Resp: (!) 24  (!) 22 20  Temp: (!) 97.5 F (36.4 C)  97.6 F (36.4 C) 98 F (36.7 C)  TempSrc: Oral  Oral Oral  SpO2: 97%  99% 96%  Weight:  67.4 kg    Height:       General exam: Asleep, laying in bed, in nad Respiratory system: Normal respiratory effort, no wheezing Cardiovascular system: regular rate, s1, s2 Gastrointestinal system: Soft, nondistended, positive BS Central nervous system: CN2-12 grossly intact, strength intact Extremities: Perfused, no clubbing Skin: Normal skin turgor, no notable skin lesions seen Psychiatry: Unable to assess  given mentation  Data Reviewed:  Labs reviewed: K 4.2, Cr 0.68  Family Communication: Pt in room, family not at bedside  Disposition: Status is: Inpatient Remains inpatient appropriate because: Severity of illness  Planned Discharge Destination: Home    Author: Marylu Lund, MD 10/06/2021 3:13 PM  For on call review [email or web address].

## 2021-10-06 NOTE — Assessment & Plan Note (Addendum)
  #)   Acute prerenal azotemia: As indicated by this evening's labs, in the absence of any acute kidney injury.  Overall, the patient appears relatively euvolemic and has received a 1 L normal saline bolus in the ED this evening.  Suspect contribution from recent course of prednisone, as above.  No evidence to suggest acute GI bleed, and no recent history of tube feeds. given her age, hemodynamic stability, ability to take p.o. the fact that she is already received a 1 L fluid bolus, and alternate explanation for acute prerenal azotemia in the form of recent steroid course, I will refrain from administration of additional IV fluids at this time, with close monitoring of ensuing volume status and renal function, as further detailed below.  Plan: Monitor strict I's and O's and daily weights.  Repeat CMP in the morning.

## 2021-10-06 NOTE — Assessment & Plan Note (Signed)
 #)   Acute hypoxic respiratory distress: in the context of acute respiratory symptoms and no known baseline supplemental O2 requirements, the patient was started on supplemental oxygen approximately 10 days ago, with ensuing diagnosis of bilateral pneumonia made this evening, as further detailed above, while noting the patient's oxygen saturations dropped into the high 80s with trial on room air in the ED this evening.  O2 sats subsequently improved back into the mid 90s with resumption of 2 L nasal cannula.  Therefore,  criteria appear to be met for acute hypoxic respiratory distress as opposed to acute hypoxic respiratory failure at this time.  No known chronic underlying pulmonary conditions .  No clinical or radiographic evidence to suggest acutely decompensated heart failure.  Today CTA chest also showed no evidence of acute pulmonary embolism nor any evidence of pleural effusion nor pneumothorax, while showing evidence of some mild plaquing involving the right lower lobe, as further detailed above, prompting additional pulmonary toilet, as further detailed below.  In terms of other considered etiologies, ACS appears less likely at this time in the absence of any recent CP and in the context of presenting EKG showing no e/o acute ischemic process. COVID-19/Influenza PCR are negative.    Plan: further evaluation/management of presenting community-acquired pneumonia, as above. Monitor continuous pulse ox with prn supplemental O2 to maintain O2 sats greater than or equal to 92%, with addition of humidity. monitor on telemetry. CMP/CBC in the AM. Check serum Mg and Phos levels. Flutter valve, incentive spirometry.  Chest physiotherapy, as above.  Prn Mucinex.  As needed albuterol inhaler.  Add on BNP.

## 2021-10-06 NOTE — Progress Notes (Signed)
Will reassess PT at later point today. PT and caregiver are both sleeping. I see in chart that PT has Dementia - RT would like to see PT awake and have caregiver awake for possible feedback. RN aware.

## 2021-10-06 NOTE — Progress Notes (Addendum)
RT spoke with PT and daughter- Flutter had already been issued and per Daughter PT can utilize and does have a strong cough ("much better than coughing before"). RT did have PT utilize Flutter and RT performed manual "hands on" CPT- resulted in PT having a dry cough. Spoke with Daughter about possibly using Vest and she kindly refused.

## 2021-10-06 NOTE — Assessment & Plan Note (Signed)
 #)   Chronic diastolic heart failure: documented history of such, although I have not yet found results of any previous echocardiograms via my initial chart review.  No clinical or radiographic evidence to suggest acute decompensated failure at this time.  Home diuretic regimen reportedly consists of the following: None.   Plan: monitor strict I's & O's and daily weights. Repeat CMP in AM. Check serum mag level.  Check BNP. Will attempt additional chart review to locate any previous echocardiogram results.

## 2021-10-06 NOTE — Hospital Course (Signed)
88 y.o. female with medical history significant for chronic diastolic heart failure, who is admitted to [facility] on 10/05/2021 with community-acquired pneumonia after presenting from SNF to [facility] ED at the direction of her PCP for evaluation of elevated D-dimer checked as an outpatient.   Imaging with findings of L sided PNA, neg for PE

## 2021-10-06 NOTE — Assessment & Plan Note (Signed)
  #)   Community-acquired pneumonia: Diagnosis on the basis of 2 weeks of progressive associated with shortness of breath, subjective fever, acute hypoxic respiratory distress, as further detailed below, and CTA chest showing evidence of left lower lobe airspace opacity associated with peribronchial wall thickening concerning for pneumonia also demonstrating peribronchial wall thickening and plaque involving the right lower lobe, in the absence of any evidence of acute pulmonary embolism, edema, effusion, or pneumothorax.  As the patient is also completed a 5 to 6-day course of prednisone 20 mg p.o. daily without any residual wheezing, refrain from additional systemic steroids at this time, noting that the duration for which she received the prednisone 20 P.o. daily does not warrant taper.   In the absence of any leukocytosis or fever, criteria are not met for sepsis.  Initial lactate 0.7.  Blood cultures x2 were collected prior to initiation of azithromycin and Rocephin, which will be continued.  COVID-19/influenza PCR negative when checked in the ED today.  In the context of ongoing productive cough in the setting of bibasilar involvement of presenting pneumonia along with a degree of plaquing involving the right lower lobe also pursue pulmonary toilet, as further detailed below.   Plan: Monitor for results of blood cultures x2.  Abx: Azithromycin and Rocephin.  Repeat CBC w/ diff in AM.  Monitor continuous pulse oximetry.  Monitor on telemetry. Prn acetaminophen for fever.  Check serum magnesium and phosphorus levels.  Add on procalcitonin level.  Check strep pneumoniae urine antigen.  Prn Mucinex.  Pulmonary toilet in the form of flutter valve, incentive spirometry, and chest physiotherapy every 8 hours.  I will also placed order to add humidity to her supplemental oxygen.  Check urinalysis.

## 2021-10-06 NOTE — Evaluation (Signed)
Clinical/Bedside Swallow Evaluation Patient Details  Name: Lashae Wollenberg MRN: [id number] Date of Birth: [birth year]  Today's Date: 10/06/2021 Time: SLP Start Time (ACUTE ONLY): 8676 SLP Stop Time (ACUTE ONLY): 1735 SLP Time Calculation (min) (ACUTE ONLY): 20 min  Past Medical History:  Past Medical History:  Diagnosis Date   Anxiety    Anxiety and depression    Arthritis    Cancer of skin of neck 1983   "right side"   CHF (congestive heart failure) ([place])    Colon cancer ([place]) ~ 2015   Dementia ([place])    Depression    Fall at home ?12/05/2015   minimally displaced tibial tuberosity fracture right knee   High cholesterol    Irregular heart beats    Meningitis 1926   caused right foot deformity    Occasional tremors    Past Surgical History:  Past Surgical History:  Procedure Laterality Date   CATARACT EXTRACTION W/ INTRAOCULAR LENS  IMPLANT, BILATERAL Bilateral    COLON SURGERY  2016   "had part of it taken out; in [place]"   [place]   "took radiation shots too"   HPI:  Patient is a 88 y.o. female with PMH: GERD, chronic diastolic heart failure, HTN, anxiety, tremors who presented from SNF to [facility] ED at the direction of her PCP for evaluation of elevated D-dimer checked as an outpatient. CXR showed patchy airspace disease in the right infrahilar lung and left base suggesting atelectasis versus minimal PNA; suspected hiatal hernia.    Assessment / Plan / Recommendation  Clinical Impression  Patient presenting with questionable dysphagia but difficult to definitively determine as patient has been having non-productive coughing in absence of PO's as well as following sips of thin liquids (water, cola) during this bedside/clinical swallow evaluation. Voice was clear and strong and no change in vocal quality or vitals following multiple sips of thin liquids. Swallow initiation appeared timely. Patient was very  thirsty but declined any solid PO's and reported similar information as her daughter had reported this morning, that she does not have a good appetite and eats very little. SLP is recommending to initiate Dys 3 solids (mechanical soft) and thin liquids and will f/u for diet toleration. If coughing symptoms persist, may need objecitve swallow study to r/o aspiration. SLP Visit Diagnosis: Dysphagia, unspecified (R13.10)    Aspiration Risk  Mild aspiration risk    Diet Recommendation Dysphagia 3 (Mech soft);Thin liquid   Liquid Administration via: Cup;Straw Medication Administration: Whole meds with liquid Supervision: Patient able to self feed;Staff to assist with self feeding Compensations: Minimize environmental distractions;Slow rate;Small sips/bites Postural Changes: Seated upright at 90 degrees    Other  Recommendations Oral Care Recommendations: Oral care BID;Staff/trained caregiver to provide oral care    Recommendations for follow up therapy are one component of a multi-disciplinary discharge planning process, led by the attending physician.  Recommendations may be updated based on patient status, additional functional criteria and insurance authorization.  Follow up Recommendations Skilled nursing-short term rehab (<3 hours/day)      Assistance Recommended at Discharge Frequent or constant Supervision/Assistance  Functional Status Assessment Patient has had a recent decline in their functional status and demonstrates the ability to make significant improvements in function in a reasonable and predictable amount of time.  Frequency and Duration min 2x/week  1 week       Prognosis Prognosis for Safe Diet Advancement:  Good      Swallow Study   General Date of Onset: 10/06/21 HPI: Patient is a 88 y.o. female with PMH: GERD, chronic diastolic heart failure, HTN, anxiety, tremors who presented from SNF to [facility] ED at the direction of her PCP for evaluation of elevated D-dimer checked  as an outpatient. CXR showed patchy airspace disease in the right infrahilar lung and left base suggesting atelectasis versus minimal PNA; suspected hiatal hernia. Type of Study: Bedside Swallow Evaluation Previous Swallow Assessment: none found Diet Prior to this Study: NPO Temperature Spikes Noted: No Respiratory Status: Nasal cannula History of Recent Intubation: No Behavior/Cognition: Alert;Cooperative;Pleasant mood Oral Cavity Assessment: Within Functional Limits Oral Care Completed by SLP: Yes Oral Cavity - Dentition: Adequate natural dentition Self-Feeding Abilities: Needs set up;Needs assist Patient Positioning: Upright in bed Baseline Vocal Quality: Normal Volitional Cough: Strong Volitional Swallow: Able to elicit    Oral/Motor/Sensory Function Overall Oral Motor/Sensory Function: Within functional limits   Ice Chips     Thin Liquid Thin Liquid: Impaired Presentation: Straw Pharyngeal  Phase Impairments: Other (comments) Other Comments: Patient with hoarse sounding, non-productive cough that occured prior to, during and after PO's and per RN has been occuring in absence of PO's.    Nectar Thick     Honey Thick     Puree Puree: Not tested   Solid     Solid: Not tested      Sonia Baller, MA, CCC-SLP Speech Therapy

## 2021-10-06 NOTE — H&P (Addendum)
History and Physical    PLEASE NOTE THAT DRAGON DICTATION SOFTWARE WAS USED IN THE CONSTRUCTION OF THIS NOTE.   Kayla Pace ENI:[id number] DOB: [birth year] DOA: 10/05/2021  PCP: Valera Castle, MD  Patient coming from: home   I have personally briefly reviewed patient's old medical records in [place]  Chief Complaint: Elevated D-dimer as outpatient  HPI: Kayla Pace is a 88 y.o. female with medical history significant for chronic diastolic heart failure, who is admitted to [facility] on 10/05/2021 with community-acquired pneumonia after presenting from SNF to [facility] ED at the direction of her PCP for evaluation of elevated D-dimer checked as an outpatient.  The following history provided by the patient, my discussions with the EDP, and via chart review.  The patient has been experiencing 2 weeks of progressive mildly productive cough associated with shortness of breath.  Approximately 1 week ago, she was evaluated by her PCP for the above, who reportedly was concerned regarding potential for acute bronchitis, subsequently initiating the patient on a week course of prednisone 20 mg p.o. daily.  Additionally, at time of this evaluation by PCP, the patient was also noted to be mildly hypoxic with oxygen saturations in the high 80s on room air, prompting initiation of supplemental oxygen in the form 2 L nasal cannula.  This is in the context of no known prior history of supplemental oxygen requirements.  Following 5 to 6 days into the intended 7-day course of prednisone, the patient was reevaluated by her PCP or interval progression of her cough with mild shortness of breath, at which time PCP also check D-dimer, which was reportedly elevated, prompting PCP to recommend that the patient present to local emergency department for further evaluation management thereof.  Patient denies any associated orthopnea, PND, or worsening of peripheral edema.  Denies any associated chest pain,  palpitations, diaphoresis, dizziness, presyncope, syncope.  Denies any wheezing, nausea, vomiting , diarrhea, abdominal pain.  She notes associated subjective fever in the absence of chills, full body rigors, generalized myalgias.  No recent reported trauma.  Per chart review, no known chronic underlying pulmonary pathology and this lifelong non-smoker.  Additionally, per chart review, she has a documented history of chronic diastolic heart failure, although I have not yet located any previous echocardiogram results to collaborate this.  Is not on a scheduled diuretic medications as an outpatient.     ED Course:  Vital signs in the ED were notable for the following: Afebrile; heart rate 71-78; blood pressure 119/61; respiratory rate 24-28; the 2 L nasal cannula that she has been on over the course the last 10 days, was transiently removed in the ED, with ensuing oxygen saturation of 88% on room air, with ensuing improvement into the range of 93 to 94% with resumption of 2 L nasal cannula.  Labs were notable for the following: BMP notable for the following: Sodium 138, bicarbonate 27, BUN 22, creatinine 0.81 compared to most recent prior serum creatinine data point of 0.77 in March 2020.  CBC notable for white cell count 8300 with 70% neutrophils.  Lactate 0.7.  Blood cultures x2 were collected prior to initiation of IV antibiotics.  COVID-19/influenza PCR negative.  Imaging and additional notable ED work-up: EKG shows sinus rhythm with heart rate 79, normal intervals and no evidence of T wave or ST changes, including no evidence of ST elevation.  Chest x-ray, 2 views, showed airspace opacity in the right infrahilar lung as well as the left, concerning for  pneumonia, in the absence of any evidence of edema, effusion, or pneumothorax.  CTA chest showed no evidence of acute pulmonary embolism while demonstrating evidence of airspace opacity in left lower lobe with peribronchial wall thickening concerning  for pneumonia, also showing peribronchial wall thickening and plugging involving the right lower lobe, in the absence of any evidence of edema, effusion, or pneumothorax.  While in the ED, the following were administered: Azithromycin, Rocephin, normal saline x1 L bolus.  Subsequently, the patient was admitted for further evaluation and management of suspected community-acquired pneumonia complicated by acute hypoxic respiratory distress.   Review of Systems: As per HPI otherwise 10 point review of systems negative.   Past Medical History:  Diagnosis Date   Anxiety    Anxiety and depression    Arthritis    Cancer of skin of neck 1983   "right side"   CHF (congestive heart failure) ([place])    Colon cancer ([place]) ~ 2015   Dementia ([place])    Depression    Fall at home ?12/05/2015   minimally displaced tibial tuberosity fracture right knee   High cholesterol    Irregular heart beats    Meningitis 1926   caused right foot deformity    Occasional tremors     Past Surgical History:  Procedure Laterality Date   CATARACT EXTRACTION W/ INTRAOCULAR LENS  IMPLANT, BILATERAL Bilateral    COLON SURGERY  2016   "had part of it taken out; in [place]"   [place]   "took radiation shots too"    Social History:  reports that she has never smoked. She has never used smokeless tobacco. She reports that she does not drink alcohol and does not use drugs.   Allergies  Allergen Reactions   Codeine Nausea And Vomiting   Morphine Other (See Comments)    Hallucinations    History reviewed. No pertinent family history.  Family history reviewed and not pertinent    Prior to Admission medications   Medication Sig Start Date End Date Taking? Authorizing Provider  acetaminophen (TYLENOL) 500 MG tablet Take 500 mg by mouth every 6 (six) hours as needed for moderate pain.   Yes [provider]  amLODipine (NORVASC)  5 MG tablet Take 5 mg by mouth daily. 09/11/21  Yes [provider]  Ascorbic Acid (VITAMIN C) 1000 MG tablet Take 1 tablet by mouth daily.   Yes [provider]  betamethasone valerate (VALISONE) 0.1 % cream Apply topically 2 (two) times daily.   Yes [provider]  carbamide peroxide (DEBROX) 6.5 % OTIC solution Place 3 drops into both ears 2 (two) times daily.   Yes [provider]  carboxymethylcellulose (ARTIFICIAL TEARS) 1 % ophthalmic solution Apply 1 drop to eye every 6 (six) hours.   Yes [provider]  carvedilol (COREG) 3.125 MG tablet Take 3.125 mg by mouth daily. 11/13/20  Yes [provider]  Cholecalciferol (VITAMIN D PO) Take 1 tablet by mouth daily.   Yes [provider]  diclofenac Sodium (VOLTAREN) 1 % GEL Apply 1 application. topically 4 (four) times daily. 09/28/21  Yes [provider]  escitalopram (LEXAPRO) 10 MG tablet Take 10 mg by mouth daily. 09/11/21  Yes [provider]  guaiFENesin (ROBITUSSIN) 100 MG/5ML liquid Take 5 mLs by mouth every 4 (four) hours as needed for cough or to loosen phlegm.   Yes [provider]  ipratropium (ATROVENT) 0.03 % nasal spray Place 2 sprays into both nostrils every 8 (eight) hours.   Yes [provider]  memantine (NAMENDA) 10 MG tablet Take 10 mg by mouth 2 (two) times daily. 09/19/21  Yes [provider]  Multiple Vitamins-Minerals (CENTRUM SILVER ADULT 50+) TABS Take 1 tablet by mouth daily.   Yes [provider]  omeprazole (PRILOSEC) 20 MG capsule Take 20 mg by mouth daily.   Yes [provider]  predniSONE (DELTASONE) 20 MG tablet Take 20 mg by mouth daily with breakfast. 10/01/21  Yes [provider]  primidone (MYSOLINE) 250 MG tablet Take 250 mg by mouth 2 (two) times daily. 02/08/13  Yes [provider]  risperiDONE (RISPERDAL) 0.25 MG tablet Take 0.25 mg by mouth daily. 09/19/21  Yes [provider]  risperiDONE (RISPERDAL) 0.5 MG tablet Take 0.5 mg by mouth at bedtime. 09/10/21  Yes [provider]  senna (SENOKOT) 8.6 MG TABS tablet Take 1 tablet (8.6 mg total) by mouth daily. Patient taking differently: Take 1 tablet by mouth at bedtime. 05/29/18  Yes Bonnielee Haff, MD  docusate sodium (COLACE) 100 MG capsule Take 1 capsule (100 mg total) by mouth 2 (two) times daily. Patient not taking: Reported on 07/22/2018 05/29/18   Bonnielee Haff, MD     Objective    Physical Exam: Vitals:   10/05/21 2146 10/05/21 2230 10/05/21 2300 10/06/21 0000  BP: 134/90 119/61 121/62 126/61  Pulse: 80 72 71 70  Resp: (!) 27 (!) 28 (!) 25 (!) 24  Temp: 97.7 F (36.5 C)     TempSrc: Oral     SpO2: 90% 92% 93% 94%  Weight:      Height:        General: appears to be stated age; alert; mildly increased work of breathing noted Skin: warm, dry, no rash Head:  AT/[place] Mouth:  Oral mucosa membranes appear moist, normal dentition Neck: supple; trachea midline Heart:  RRR; did not appreciate any M/R/G Lungs: Mild bilateral rales noted, but otherwise CTAB, did not appreciate any wheezes, or rhonchi Abdomen: + BS; soft, ND, NT Vascular: 2+ pedal pulses b/l; 2+ radial pulses b/l Extremities: no peripheral edema, no muscle wasting Neuro: strength and sensation intact in upper and lower extremities b/l     Labs on Admission: I have personally reviewed following labs and imaging studies  CBC: Recent Labs  Lab 10/05/21 2225  WBC 8.3  NEUTROABS 5.8  HGB 14.2  HCT 43.4  MCV 98.2  PLT 175   Basic Metabolic Panel: Recent Labs  Lab 10/05/21 2225  NA 138  K 4.5  CL 105  CO2 27  GLUCOSE 115*  BUN 22  CREATININE 0.81  CALCIUM 8.9   GFR: Estimated Creatinine Clearance: 26.4 mL/min (by C-G formula based on SCr of 0.81 mg/dL). Liver Function Tests: No results for input(s): AST, ALT, ALKPHOS, BILITOT, PROT, ALBUMIN in the last 168 hours. No results for input(s): LIPASE,  AMYLASE in the last 168 hours. No results for input(s): AMMONIA in the last 168 hours. Coagulation Profile: No results for input(s): INR, PROTIME in the last 168 hours. Cardiac Enzymes: No results for input(s): CKTOTAL, CKMB, CKMBINDEX, TROPONINI in the last 168 hours. BNP (last 3 results) No results for input(s): PROBNP in the last 8760 hours. HbA1C: No results for input(s): HGBA1C in the last 72 hours. CBG: No results for input(s): GLUCAP in the last 168 hours. Lipid Profile: No results for input(s): CHOL, HDL,  LDLCALC, TRIG, CHOLHDL, LDLDIRECT in the last 72 hours. Thyroid Function Tests: No results for input(s): TSH, T4TOTAL, FREET4, T3FREE, THYROIDAB in the last 72 hours. Anemia Panel: No results for input(s): VITAMINB12, FOLATE, FERRITIN, TIBC, IRON, RETICCTPCT in the last 72 hours. Urine analysis:    Component Value Date/Time   COLORURINE YELLOW 07/22/2018 1053   APPEARANCEUR HAZY (A) 07/22/2018 1053   LABSPEC 1.010 07/22/2018 1053   PHURINE 6.0 07/22/2018 1053   [place] 07/22/2018 1053   HGBUR NEGATIVE 07/22/2018 [place] 07/22/2018 [place] 07/22/2018 1053   PROTEINUR NEGATIVE 07/22/2018 1053   NITRITE NEGATIVE 07/22/2018 1053   LEUKOCYTESUR TRACE (A) 07/22/2018 1053    Radiological Exams on Admission: DG Chest 2 View  Result Date: 10/05/2021 CLINICAL DATA:  Cough EXAM: CHEST - 2 VIEW COMPARISON:  None Available. FINDINGS: Patchy airspace disease at the left base. Mild right infrahilar and lower lung opacity. No pleural effusion. Suspect hiatal hernia. Normal cardiac size. Aortic atherosclerosis. IMPRESSION: 1. Patchy airspace disease in the right infrahilar lung and left base, atelectasis versus minimal pneumonia 2. Suspect hiatal hernia Electronically Signed   By: Donavan Foil M.D.   On: 10/05/2021 22:19   CT Angio Chest PE W and/or Wo Contrast  Result Date: 10/05/2021 CLINICAL DATA:  Cough with positive D-dimer EXAM: CT  ANGIOGRAPHY CHEST WITH CONTRAST TECHNIQUE: Multidetector CT imaging of the chest was performed using the standard protocol during bolus administration of intravenous contrast. Multiplanar CT image reconstructions and MIPs were obtained to evaluate the vascular anatomy. RADIATION DOSE REDUCTION: This exam was performed according to the departmental dose-optimization program which includes automated exposure control, adjustment of the mA and/or kV according to patient size and/or use of iterative reconstruction technique. CONTRAST:  51m OMNIPAQUE IOHEXOL 350 MG/ML SOLN COMPARISON:  None Available. FINDINGS: Cardiovascular: The aorta is diffusely ectatic without focal dilatation. There are atherosclerotic calcifications of the aorta. The heart is enlarged. There is no pericardial effusion. There is adequate opacification of the pulmonary arteries to the segmental level. There is no evidence for pulmonary embolism. Mediastinum/Nodes: No enlarged mediastinal, hilar, or axillary lymph nodes. Thyroid gland, trachea, and esophagus demonstrate no significant findings. Lungs/Pleura: There is mild atelectasis and airspace disease in the medial left lower lobe. There is left lower lobe central peribronchial wall thickening. There is also right lower lobe peribronchial wall thickening with secretions seen in the bronchi. There is minimal right basilar atelectasis. There is a nodular density in the right middle lobe measuring 4 mm image 6/87. There is no pleural effusion or pneumothorax. Upper Abdomen: No acute abnormality. Musculoskeletal: No chest wall abnormality. No acute or significant osseous findings. Review of the MIP images confirms the above findings. IMPRESSION: 1. No evidence for pulmonary embolism. 2. Minimal patchy atelectasis and airspace disease in the left lower lobe with peribronchial wall thickening worrisome for infection. 3. There is also peribronchial wall thickening and plugging of the bronchi involving  the right lower lobe. Correlate clinically for aspiration. 4. Cardiomegaly. 5.  Aortic Atherosclerosis (ICD10-I70.0). Electronically Signed   By: ARonney AstersM.D.   On: 10/05/2021 23:37     EKG: Independently reviewed, with result as described above.    Assessment/Plan    Principal Problem:   CAP (community acquired pneumonia) Active Problems:   Anxiety and depression   Acute respiratory failure with hypoxia (HCC)   Acute prerenal azotemia   Chronic diastolic CHF (congestive heart failure) (HCC)   Essential hypertension   GERD (  gastroesophageal reflux disease)   Tremors of nervous system        #) Community-acquired pneumonia: Diagnosis on the basis of 2 weeks of progressive associated with shortness of breath, subjective fever, acute hypoxic respiratory distress, as further detailed below, and CTA chest showing evidence of left lower lobe airspace opacity associated with peribronchial wall thickening concerning for pneumonia also demonstrating peribronchial wall thickening and plaque involving the right lower lobe, in the absence of any evidence of acute pulmonary embolism, edema, effusion, or pneumothorax.  As the patient is also completed a 5 to 6-day course of prednisone 20 mg p.o. daily without any residual wheezing, refrain from additional systemic steroids at this time, noting that the duration for which she received the prednisone 20 P.o. daily does not warrant taper.   In the absence of any leukocytosis or fever, criteria are not met for sepsis.  Initial lactate 0.7.  Blood cultures x2 were collected prior to initiation of azithromycin and Rocephin, which will be continued.  COVID-19/influenza PCR negative when checked in the ED today.  In the context of ongoing productive cough in the setting of bibasilar involvement of presenting pneumonia along with a degree of plaquing involving the right lower lobe also pursue pulmonary toilet, as further detailed below.   Plan:  Monitor for results of blood cultures x2.  Abx: Azithromycin and Rocephin.  Repeat CBC w/ diff in AM.  Monitor continuous pulse oximetry.  Monitor on telemetry. Prn acetaminophen for fever.  Check serum magnesium and phosphorus levels.  Add on procalcitonin level.  Check strep pneumoniae urine antigen.  Prn Mucinex.  Pulmonary toilet in the form of flutter valve, incentive spirometry, and chest physiotherapy every 8 hours.  I will also placed order to add humidity to her supplemental oxygen.  Check urinalysis.         #) Acute hypoxic respiratory distress: in the context of acute respiratory symptoms and no known baseline supplemental O2 requirements, the patient was started on supplemental oxygen approximately 10 days ago, with ensuing diagnosis of bilateral pneumonia made this evening, as further detailed above, while noting the patient's oxygen saturations dropped into the high 80s with trial on room air in the ED this evening.  O2 sats subsequently improved back into the mid 90s with resumption of 2 L nasal cannula.  Therefore,  criteria appear to be met for acute hypoxic respiratory distress as opposed to acute hypoxic respiratory failure at this time.  No known chronic underlying pulmonary conditions .  No clinical or radiographic evidence to suggest acutely decompensated heart failure.  Today CTA chest also showed no evidence of acute pulmonary embolism nor any evidence of pleural effusion nor pneumothorax, while showing evidence of some mild plaquing involving the right lower lobe, as further detailed above, prompting additional pulmonary toilet, as further detailed below.  In terms of other considered etiologies, ACS appears less likely at this time in the absence of any recent CP and in the context of presenting EKG showing no e/o acute ischemic process. COVID-19/Influenza PCR are negative.    Plan: further evaluation/management of presenting community-acquired pneumonia, as above. Monitor  continuous pulse ox with prn supplemental O2 to maintain O2 sats greater than or equal to 92%, with addition of humidity. monitor on telemetry. CMP/CBC in the AM. Check serum Mg and Phos levels. Flutter valve, incentive spirometry.  Chest physiotherapy, as above.  Prn Mucinex.  As needed albuterol inhaler.  Add on BNP.         #)  Acute prerenal azotemia: As indicated by this evening's labs, in the absence of any acute kidney injury.  Overall, the patient appears relatively euvolemic and has received a 1 L normal saline bolus in the ED this evening.  Suspect contribution from recent course of prednisone, as above.  No evidence to suggest acute GI bleed, and no recent history of tube feeds. given her age, hemodynamic stability, ability to take p.o. the fact that she is already received a 1 L fluid bolus, and alternate explanation for acute prerenal azotemia in the form of recent steroid course, I will refrain from administration of additional IV fluids at this time, with close monitoring of ensuing volume status and renal function, as further detailed below.  Plan: Monitor strict I's and O's and daily weights.  Repeat CMP in the morning.        #) Depression: Documented history of such, on Lexapro as an outpatient.  No evidence of QTc prolongation on presenting EKG.  Plan: Continue outpatient Lexapro.            #) Chronic diastolic heart failure: documented history of such, although I have not yet found results of any previous echocardiograms via my initial chart review.  No clinical or radiographic evidence to suggest acute decompensated failure at this time.  Home diuretic regimen reportedly consists of the following: None.   Plan: monitor strict I's & O's and daily weights. Repeat CMP in AM. Check serum mag level.  Check BNP. Will attempt additional chart review to locate any previous echocardiogram results.            #) GERD: documented h/o such; on omeprazole as  outpatient.   Plan: continue home PPI.          #) Essential tremors: history of such, on primidone as an outpatient.  Plan: Continue outpatient primidone.          #) Essential Hypertension: documented h/o such, with outpatient antihypertensive regimen including Coreg as well as amlodipine.  SBP's in the ED today: Normotensive.  In the context of presenting infectious process, will be cautious with resumption of outpatient antihypertensive medications.  In the context of presenting pneumonia, will hold outpatient nonselective beta-blocker for now, with plan for resumption of outpatient amlodipine in the morning.  Plan: Close monitoring of subsequent BP via routine VS. hold home Coreg for now, while resuming home amlodipine in the morning.         DVT prophylaxis: SCD's   Code Status: DNR/DNI, as confirmed by documentation provided by SNF Family Communication: none Disposition Plan: Per Rounding Team Consults called: none;  Admission status: Inpatient   PLEASE NOTE THAT DRAGON DICTATION SOFTWARE WAS USED IN THE CONSTRUCTION OF THIS NOTE.   [place] DO [facility] [place]   10/06/2021, 12:57 AM

## 2021-10-06 NOTE — Assessment & Plan Note (Signed)
  #)   Essential tremors: history of such, on primidone as an outpatient.  Plan: Continue outpatient primidone.

## 2021-10-06 NOTE — Assessment & Plan Note (Signed)
 #)   Essential Hypertension: documented h/o such, with outpatient antihypertensive regimen including Coreg as well as amlodipine.  SBP's in the ED today: Normotensive.  In the context of presenting infectious process, will be cautious with resumption of outpatient antihypertensive medications.  In the context of presenting pneumonia, will hold outpatient nonselective beta-blocker for now, with plan for resumption of outpatient amlodipine in the morning.  Plan: Close monitoring of subsequent BP via routine VS. hold home Coreg for now, while resuming home amlodipine in the morning.

## 2021-10-07 DIAGNOSIS — F419 Anxiety disorder, unspecified: Secondary | ICD-10-CM | POA: Diagnosis not present

## 2021-10-07 DIAGNOSIS — N19 Unspecified kidney failure: Secondary | ICD-10-CM | POA: Diagnosis not present

## 2021-10-07 DIAGNOSIS — F32A Depression, unspecified: Secondary | ICD-10-CM | POA: Diagnosis not present

## 2021-10-07 LAB — CBC
HCT: 42.1 % (ref 36.0–46.0)
Hemoglobin: 13.4 g/dL (ref 12.0–15.0)
MCH: 32.1 pg (ref 26.0–34.0)
MCHC: 31.8 g/dL (ref 30.0–36.0)
MCV: 101 fL — ABNORMAL HIGH (ref 80.0–100.0)
Platelets: 270 10*3/uL (ref 150–400)
RBC: 4.17 MIL/uL (ref 3.87–5.11)
RDW: 13.4 % (ref 11.5–15.5)
WBC: 11.6 10*3/uL — ABNORMAL HIGH (ref 4.0–10.5)
nRBC: 0 % (ref 0.0–0.2)

## 2021-10-07 LAB — COMPREHENSIVE METABOLIC PANEL
ALT: 61 U/L — ABNORMAL HIGH (ref 0–44)
AST: 35 U/L (ref 15–41)
Albumin: 2.8 g/dL — ABNORMAL LOW (ref 3.5–5.0)
Alkaline Phosphatase: 82 U/L (ref 38–126)
Anion gap: 6 (ref 5–15)
BUN: 13 mg/dL (ref 8–23)
CO2: 28 mmol/L (ref 22–32)
Calcium: 8.6 mg/dL — ABNORMAL LOW (ref 8.9–10.3)
Chloride: 107 mmol/L (ref 98–111)
Creatinine, Ser: 0.71 mg/dL (ref 0.44–1.00)
GFR, Estimated: >60 mL/min (ref >60)
Glucose, Bld: 117 mg/dL — ABNORMAL HIGH (ref 70–99)
Potassium: 4 mmol/L (ref 3.5–5.1)
Sodium: 141 mmol/L (ref 135–145)
Total Bilirubin: 0.5 mg/dL (ref 0.3–1.2)
Total Protein: 6.6 g/dL (ref 6.5–8.1)

## 2021-10-07 LAB — STREP PNEUMONIAE URINARY ANTIGEN: Strep Pneumo Urinary Antigen: NEGATIVE

## 2021-10-07 MED ORDER — HYDROCOD POLI-CHLORPHE POLI ER 10-8 MG/5ML PO SUER
5.0000 mL | Freq: Two times a day (BID) | ORAL | Status: DC | PRN
  Administered 2021-10-07 – 2021-10-10 (×5): 5 mL via ORAL
  Filled 2021-10-07 (×5): qty 5

## 2021-10-07 NOTE — Progress Notes (Signed)
Assumed care of patient from off going RN.No changes in initial am assessment. Cont with plan of care

## 2021-10-07 NOTE — Progress Notes (Signed)
  Progress Note   Patient: Kayla Pace EQA:[id number] DOB: 24-Sep-1921 DOA: 10/05/2021     1 DOS: the patient was seen and examined on 10/07/2021   Brief hospital course: 88 y.o. female with medical history significant for chronic diastolic heart failure, who is admitted to [facility] on 10/05/2021 with community-acquired pneumonia after presenting from SNF to [facility] ED at the direction of her PCP for evaluation of elevated D-dimer checked as an outpatient.   Imaging with findings of L sided PNA, neg for PE  Assessment and Plan: CAP -Imaging reviewed with findings suggestive of L sided PNA -Cont on empiric rocephin and azithro -No leukocytosis. Afebrile -On 3LNC, cont to wean O2 as tolerated - Cont antitussive as needed  Tremors -Seems stable this AM -On primidone PTA  GERD -Seems stable  HTN -BP stable and controlled at this time -Cont current regimen as tolerated  Chronic diastolic CHF -Appears euvoliemic on exam -Cont to follow I/O's  Pre-renal azotemia -Renal function normal with Cr 0.68 -Cont to follow bmet trends  Acute hypoxemic respiratory failure -Currently on 2LNC -Cont to wean O2 as tolerated  Anxiety -Asleep this AM, seems stable      Subjective: Difficult to assess this AM given mentation  Physical Exam: Vitals:   10/07/21 0740 10/07/21 1002 10/07/21 1404 10/07/21 1407  BP:  (!) 155/80 (!) 149/64   Pulse:  80 74   Resp:  (!) 22 20   Temp:  98.2 F (36.8 C) 97.9 F (36.6 C)   TempSrc:  Oral Oral   SpO2: 96% 97% 91% 95%  Weight:      Height:       General exam: Asleep, in no acute distress Respiratory system: normal chest rise, clear, no audible wheezing Cardiovascular system: regular rhythm, s1-s2 Gastrointestinal system: Nondistended, nontender, pos BS Central nervous system: No seizures, no tremors Extremities: No cyanosis, no joint deformities Skin: No rashes, no pallor Psychiatry: difficult to assess given mentation  Data  Reviewed:  Labs reviewed: K 4.0, Cr 0.71  Family Communication: Pt in room, family not at bedside  Disposition: Status is: Inpatient Remains inpatient appropriate because: Severity of illness  Planned Discharge Destination: Home    Author: Marylu Lund, MD 10/07/2021 2:15 PM  For on call review [email or web address].

## 2021-10-08 DIAGNOSIS — F32A Depression, unspecified: Secondary | ICD-10-CM | POA: Diagnosis not present

## 2021-10-08 DIAGNOSIS — N19 Unspecified kidney failure: Secondary | ICD-10-CM | POA: Diagnosis not present

## 2021-10-08 DIAGNOSIS — F419 Anxiety disorder, unspecified: Secondary | ICD-10-CM | POA: Diagnosis not present

## 2021-10-08 LAB — COMPREHENSIVE METABOLIC PANEL
ALT: 59 U/L — ABNORMAL HIGH (ref 0–44)
AST: 37 U/L (ref 15–41)
Albumin: 2.9 g/dL — ABNORMAL LOW (ref 3.5–5.0)
Alkaline Phosphatase: 85 U/L (ref 38–126)
Anion gap: 5 (ref 5–15)
BUN: 8 mg/dL (ref 8–23)
CO2: 27 mmol/L (ref 22–32)
Calcium: 8.7 mg/dL — ABNORMAL LOW (ref 8.9–10.3)
Chloride: 111 mmol/L (ref 98–111)
Creatinine, Ser: 0.71 mg/dL (ref 0.44–1.00)
GFR, Estimated: >60 mL/min (ref >60)
Glucose, Bld: 117 mg/dL — ABNORMAL HIGH (ref 70–99)
Potassium: 4 mmol/L (ref 3.5–5.1)
Sodium: 143 mmol/L (ref 135–145)
Total Bilirubin: 0.6 mg/dL (ref 0.3–1.2)
Total Protein: 6.8 g/dL (ref 6.5–8.1)

## 2021-10-08 LAB — CBC
HCT: 41.9 % (ref 36.0–46.0)
Hemoglobin: 13.1 g/dL (ref 12.0–15.0)
MCH: 32.2 pg (ref 26.0–34.0)
MCHC: 31.3 g/dL (ref 30.0–36.0)
MCV: 102.9 fL — ABNORMAL HIGH (ref 80.0–100.0)
Platelets: 246 10*3/uL (ref 150–400)
RBC: 4.07 MIL/uL (ref 3.87–5.11)
RDW: 13.2 % (ref 11.5–15.5)
WBC: 10.3 10*3/uL (ref 4.0–10.5)
nRBC: 0 % (ref 0.0–0.2)

## 2021-10-08 NOTE — Evaluation (Signed)
Occupational Therapy Evaluation Patient Details Name: Kayla Pace MRN: [id number] DOB: [birth year] Today's Date: 10/08/2021   History of Present Illness Patient is a 88 year old female who was admitted with CAP,and acute hypoxic resperatory distress. PMH: CHF, colon cancer, dementia, R tibia fx, UTI.   Clinical Impression   Patient is a 88 year old female who was admitted for above.  Patient is a long term care resident from [place]. Patient appears to be at baseline for ADLs with patient able to engage in self feeding sitting in chair position in bed with use of BUE. Patient needed cues to slow down and stop coughing prior to continuation of self feeding tasks. Patients daughter was present during portion of session. Plan for patient to transition back to [facility] to continue long term care. No acute OT needs identified at this time. OT singing off. Defer to SNF rehab team to assess patient if there are continued therapy needs at time of readmission to facility.      Recommendations for follow up therapy are one component of a multi-disciplinary discharge planning process, led by the attending physician.  Recommendations may be updated based on patient status, additional functional criteria and insurance authorization.   Follow Up Recommendations  Other (comment) (patient to return to SNF for long term care. defer skilled OT services to this venue at this time)    Assistance Recommended at Discharge Frequent or constant Supervision/Assistance  Patient can return home with the following Two people to help with walking and/or transfers;Assistance with cooking/housework;Direct supervision/assist for financial management;Assist for transportation;Assistance with feeding;Direct supervision/assist for medications management;Help with stairs or ramp for entrance;Two people to help with bathing/dressing/bathroom    Functional Status Assessment  Patient has not had a recent decline in their functional  status  Equipment Recommendations  None recommended by OT    Recommendations for Other Services       Precautions / Restrictions Restrictions Weight Bearing Restrictions: No      Mobility   Balance    ADL either performed or assessed with clinical judgement   ADL Overall ADL's : At baseline         General ADL Comments: patient is TD for bathing and dressing tasks at baseline at SNF. patient is also using total lift for transfers at this time per daughter report. patient was able to engage in self feeding tasks with sandwich on this date in chair position in bed. patietn was able to pick up cup and take small sips with minimal spillage noted from lips with education to take small sip.patietn needed cues to slow down on eating especially when coughing started. daughter was able to provide cues in room. nurse made aware.  patient's daugther was present towards the end of session with reports that patient plans to transition back to SNF to continue LTC. defer any skilled OT needs to next level of care.     Vision Baseline Vision/History: 1 Wears glasses Patient Visual Report: No change from baseline       Perception     Praxis      Pertinent Vitals/Pain Pain Assessment Pain Assessment: No/denies pain     Hand Dominance     Extremity/Trunk Assessment Upper Extremity Assessment Upper Extremity Assessment: RUE deficits/detail;LUE deficits/detail RUE Deficits / Details: h/o contracture middle digit from arthritis. elbows and shoulders AROM WFL with encouragement to participate. patient was noted to have increased edmea in [place] at this time. LUE Deficits / Details: h/o contracture with patient reporting old  fx in middle digit.           Communication Communication Communication: No difficulties   Cognition Arousal/Alertness: Awake/alert Behavior During Therapy: Impulsive Overall Cognitive Status: History of cognitive impairments - at baseline             General  Comments: patient is oriented to self with history of confusion. patient asked where she was and was oriented to the hospital. patient was able to recognize her daughter when she entered the room.     General Comments  patient was able to tolerate chair position in bed with no leaning noted during self feeding tasks. patient noted to have continued coughting with no productiveness during session.    Exercises     Shoulder Instructions      Home Living Family/patient expects to be discharged to:: Skilled nursing facility           Additional Comments: patient lives at [facility] for long term care. patient uses wheelchair for functional mobility majority of time out of bed.      Prior Functioning/Environment Prior Level of Function : Needs assist       Physical Assist : ADLs (physical);Mobility (physical) Mobility (physical): Transfers;Bed mobility;Gait ADLs (physical): Grooming;Bathing;Toileting;Dressing;IADLs Mobility Comments: patient was walking with functional maintenance 3x a week for 15 mins. patient is currently at total lift for transfers. ADLs Comments: patient is TD for bathing, dressing and toileting tasks. patient was still able to engage in self feeding        OT Problem List:        OT Treatment/Interventions:      OT Goals(Current goals can be found in the care plan section) Acute Rehab OT Goals Patient Stated Goal: to drink her coke OT Goal Formulation: All assessment and education complete, DC therapy         AM-PAC OT "6 Clicks" Daily Activity     Outcome Measure Help from another person eating meals?: A Little Help from another person taking care of personal grooming?: A Little Help from another person toileting, which includes using toliet, bedpan, or urinal?: Total Help from another person bathing (including washing, rinsing, drying)?: Total Help from another person to put on and taking off regular upper body clothing?: Total Help from another  person to put on and taking off regular lower body clothing?: Total 6 Click Score: 10   End of Session Nurse Communication: Other (comment) (ok to participate and updated on recommendations)  Activity Tolerance: Patient tolerated treatment well Patient left: in bed;with call bell/phone within reach;with family/visitor present  OT Visit Diagnosis: Muscle weakness (generalized) (M62.81)                Time: 3559-7416 OT Time Calculation (min): 21 min Charges:  OT General Charges $OT Visit: 1 Visit OT Evaluation $OT Eval Low Complexity: 1 Low  Leota Sauers, MS Acute Rehabilitation Department Office# [phone] Pager# [phone]   Marcellina Millin 10/08/2021, 10:48 AM

## 2021-10-08 NOTE — TOC Initial Note (Addendum)
Transition of Care [facility]) - Initial/Assessment Note    Patient Details  Name: Kayla Pace MRN: [id number] Date of Birth: 08/24/1921  [facility]) CM/SW Contact:    Dessa Phi, RN Phone Number: 10/08/2021, 12:58 PM  Clinical Narrative:  From [facility]-LTC for return.                 Expected Discharge Plan: Skilled Nursing Facility Barriers to Discharge: Continued Medical Work up   Patient Goals and CMS Choice Patient states their goals for this hospitalization and ongoing recovery are:: Return [facility]SNF CMS Medicare.gov Compare Post Acute Care list provided to:: Patient Represenative (must comment) (dtr Leanne) Choice offered to / list presented to : Adult Children  Expected Discharge Plan and Services Expected Discharge Plan: [place]   Discharge Planning Services: CM Consult Post Acute Care Choice: [place] Living arrangements for the past 2 months: [place]                                      Prior Living Arrangements/Services Living arrangements for the past 2 months: [place] Lives with:: Facility Resident   Do you feel safe going back to the place where you live?: Yes      Need for Family Participation in Patient Care: Yes (Comment) Care giver support system in place?: Yes (comment)   Criminal Activity/Legal Involvement Pertinent to Current Situation/Hospitalization: No - Comment as needed  Activities of Daily Living Home Assistive Devices/Equipment: Dentures (specify type), Wheelchair, Environmental consultant (specify type), Eyeglasses (only uses walker three times a week for 15 mintues) ADL Screening (condition at time of admission) Patient's cognitive ability adequate to safely complete daily activities?: No Is the patient deaf or have difficulty hearing?: Yes Does the patient have difficulty seeing, even when wearing glasses/contacts?: Yes (patient does wear glasses) Does  the patient have difficulty concentrating, remembering, or making decisions?: Yes Patient able to express need for assistance with ADLs?: No Does the patient have difficulty dressing or bathing?: Yes Independently performs ADLs?: No Communication: Independent Dressing (OT): Dependent Is this a change from baseline?: Pre-admission baseline Grooming: Dependent Is this a change from baseline?: Pre-admission baseline Feeding: Needs assistance Is this a change from baseline?: Pre-admission baseline Bathing: Dependent Is this a change from baseline?: Pre-admission baseline Toileting: Dependent Is this a change from baseline?: Pre-admission baseline In/Out Bed: Dependent Is this a change from baseline?: Pre-admission baseline Walks in Home: Dependent (patient at skilled nursing;) Is this a change from baseline?: Pre-admission baseline Does the patient have difficulty walking or climbing stairs?: Yes Weakness of Legs: Both Weakness of Arms/Hands: Both  Permission Sought/Granted Permission sought to share information with : Case Manager Permission granted to share information with : Yes, Verbal Permission Granted  Share Information with NAME: Case Manager           Emotional Assessment Appearance:: Appears stated age Attitude/Demeanor/Rapport: Gracious   Orientation: : Oriented to Self, Oriented to Place, Oriented to  Time, Oriented to Situation Alcohol / Substance Use: Not Applicable Psych Involvement: No (comment)  Admission diagnosis:  Bronchitis [J40] Hypoxia [R09.02] CAP (community acquired pneumonia) [J18.9] Community acquired pneumonia of right middle lobe of lung [J18.9] Patient Active Problem List   Diagnosis Date Noted   CAP (community acquired pneumonia) 10/06/2021   Acute respiratory failure with hypoxia ([place]) 10/06/2021   Acute prerenal azotemia 10/06/2021   Chronic diastolic CHF (congestive  heart failure) ([place]) 10/06/2021   Essential hypertension 10/06/2021    GERD (gastroesophageal reflux disease) 10/06/2021   Tremors of nervous system 10/06/2021   Acute lower UTI 05/26/2018   Failure of outpatient treatment 05/26/2018   Anxiety and depression 05/26/2018   Dementia without behavioral disturbance ([place]) 05/26/2018   Dyslipidemia 05/26/2018   H/O colon cancer, stage I 05/26/2018   H/O CHF 05/26/2018   UTI (urinary tract infection) 05/26/2018   Failure to thrive in adult 12/07/2015   PCP:  Valera Castle, MD Pharmacy:   [facility] [address], [place] [address] Phone: [phone] Fax: [phone]  [facility] [place], [place] [address] [place] [address] Phone: [phone] Fax: [phone]     Social Determinants of Health (SDOH) Interventions    Readmission Risk Interventions     View : No data to display.

## 2021-10-08 NOTE — Plan of Care (Signed)
  Problem: Activity: Goal: Risk for activity intolerance will decrease Outcome: Progressing   Problem: Pain Managment: Goal: General experience of comfort will improve Outcome: Progressing   Problem: Respiratory: Goal: Ability to maintain adequate ventilation will improve Outcome: Progressing Goal: Ability to maintain a clear airway will improve Outcome: Progressing

## 2021-10-08 NOTE — Evaluation (Signed)
Physical Therapy One Time Evaluation Patient Details Name: Kayla Pace MRN: [id number] DOB: Jul 19, 1921 Today's Date: 10/08/2021  History of Present Illness  Patient is a 88 year old female who was admitted with CAP,and acute hypoxic resperatory distress. PMH: CHF, colon cancer, dementia, R tibia fx, UTI.  Clinical Impression  Patient evaluated by Physical Therapy with no further acute PT needs identified. All education has been completed and the patient has no further questions.  Pt sleepy on arrival and soiled bed linen.  Pt assisted with rolling and pericare/linen change.  Pt total assist for bed mobility and reports using lift for OOB to w/c at facility.  SpO2 dropped to 84% on room air with rolling so 2L O2 [place] reapplied and SPO2 improved to 93%.  Pt appears total care and per reviewing OT evaluation in which daughter was present and reports pt plans to return to long term care facility.  PT is signing off. Thank you for this referral.      Recommendations for follow up therapy are one component of a multi-disciplinary discharge planning process, led by the attending physician.  Recommendations may be updated based on patient status, additional functional criteria and insurance authorization.  Follow Up Recommendations No PT follow up    Assistance Recommended at Discharge    Patient can return home with the following       Equipment Recommendations None recommended by PT  Recommendations for Other Services       Functional Status Assessment Patient has had a recent decline in their functional status and/or demonstrates limited ability to make significant improvements in function in a reasonable and predictable amount of time     Precautions / Restrictions Precautions Precautions: Fall Precaution Comments: monitor sats Restrictions Weight Bearing Restrictions: No      Mobility  Bed Mobility Overal bed mobility: Needs Assistance Bed Mobility: Rolling Rolling: +2 for  physical assistance, Total assist         General bed mobility comments: pt encouraged to assist with rolling by reaching and holding rail yet still required total assist; performed rolling for hygiene    Transfers                        Ambulation/Gait                  Stairs            Wheelchair Mobility    Modified Rankin (Stroke Patients Only)       Balance                                             Pertinent Vitals/Pain Pain Assessment Pain Assessment: No/denies pain    Home Living Family/patient expects to be discharged to:: Skilled nursing facility                   Additional Comments: patient lives at [facility] for long term care. patient uses wheelchair for functional mobility majority of time out of bed.    Prior Function Prior Level of Function : Needs assist       Physical Assist : ADLs (physical);Mobility (physical) Mobility (physical): Transfers;Bed mobility;Gait ADLs (physical): Grooming;Bathing;Toileting;Dressing;IADLs Mobility Comments: patient was walking with functional maintenance 3x a week for 15 mins. patient is currently at total lift for transfers. ADLs Comments: patient is TD for  bathing, dressing and toileting tasks. patient was still able to engage in self feeding     Hand Dominance        Extremity/Trunk Assessment   Upper Extremity Assessment Upper Extremity Assessment: RUE deficits/detail;LUE deficits/detail RUE Deficits / Details: h/o contracture middle digit from arthritis. elbows and shoulders AROM WFL with encouragement to participate. patient was noted to have increased edmea in [place] at this time. LUE Deficits / Details: h/o contracture with patient reporting old fx in middle digit.    Lower Extremity Assessment Lower Extremity Assessment: RLE deficits/detail;LLE deficits/detail RLE Deficits / Details: bil ankle and hip ROM deficits, pt rests with feet in PF and hips  internally rotated       Communication   Communication: No difficulties  Cognition Arousal/Alertness: Awake/alert Behavior During Therapy: Flat affect Overall Cognitive Status: History of cognitive impairments - at baseline                                          General Comments General comments (skin integrity, edema, etc.): patient was able to tolerate chair position in bed with no leaning noted during self feeding tasks. patient noted to have continued coughting with no productiveness during session.    Exercises     Assessment/Plan    PT Assessment Patient does not need any further PT services  PT Problem List         PT Treatment Interventions      PT Goals (Current goals can be found in the Care Plan section)  Acute Rehab PT Goals PT Goal Formulation: All assessment and education complete, DC therapy    Frequency       Co-evaluation               AM-PAC PT "6 Clicks" Mobility  Outcome Measure Help needed turning from your back to your side while in a flat bed without using bedrails?: Total Help needed moving from lying on your back to sitting on the side of a flat bed without using bedrails?: Total Help needed moving to and from a bed to a chair (including a wheelchair)?: Total Help needed standing up from a chair using your arms (e.g., wheelchair or bedside chair)?: Total Help needed to walk in hospital room?: Total Help needed climbing 3-5 steps with a railing? : Total 6 Click Score: 6    End of Session Equipment Utilized During Treatment: Oxygen Activity Tolerance: Patient limited by fatigue Patient left: in bed;with call bell/phone within reach Nurse Communication: Mobility status PT Visit Diagnosis: Adult, failure to thrive (R62.7)    Time: 1735-6701 PT Time Calculation (min) (ACUTE ONLY): 19 min   Charges:   PT Evaluation $PT Eval Low Complexity: 1 Low        Kati PT, DPT Acute Rehabilitation Services Pager:  [phone] Office: [place] 10/08/2021, 11:02 AM

## 2021-10-08 NOTE — Progress Notes (Signed)
  Progress Note   Patient: Kayla Pace HEN:[id number] DOB: 15-Feb-1922 DOA: 10/05/2021     2 DOS: the patient was seen and examined on 10/08/2021   Brief hospital course: 88 y.o. female with medical history significant for chronic diastolic heart failure, who is admitted to [facility] on 10/05/2021 with community-acquired pneumonia after presenting from SNF to [facility] ED at the direction of her PCP for evaluation of elevated D-dimer checked as an outpatient.   Imaging with findings of L sided PNA, neg for PE  Assessment and Plan: CAP -Imaging reviewed with findings suggestive of L sided PNA -Cont on empiric rocephin and azithro -No leukocytosis. Afebrile -On 3LNC, cont to wean as tolerated. Cont flutter valve as tolerated - Cont antitussive as needed  Tremors -Seems stable this AM -On primidone PTA  GERD -Seems stable  HTN -BP stable and controlled at this time -Cont current regimen as tolerated  Chronic diastolic CHF -Appears euvoliemic on exam -Patient not tolerating much p.o., recently given IV fluids  Pre-renal azotemia -Renal function normal with Cr 0.71 -Cont to follow bmet trends  Acute hypoxemic respiratory failure -Currently on 3LNC -Cont to wean O2 as tolerated  Anxiety -Appears to be stable at this time      Subjective: Does report some mild shortness of breath  Physical Exam: Vitals:   10/08/21 0436 10/08/21 0725 10/08/21 0822 10/08/21 1421  BP:   (!) 142/70   Pulse:   77 77  Resp:  (!) 24 20   Temp:   (!) 97.4 F (36.3 C)   TempSrc:   Oral   SpO2:  98% 93% 94%  Weight: 69.8 kg     Height:       General exam: Conversant, in no acute distress Respiratory system: normal chest rise, clear, no audible wheezing Cardiovascular system: regular rhythm, s1-s2 Gastrointestinal system: Nondistended, nontender, pos BS Central nervous system: No seizures, no tremors Extremities: No cyanosis, no joint deformities Skin: No rashes, no pallor Psychiatry:  Affect normal // no auditory hallucinations   Data Reviewed:  Labs reviewed: Her potassium 4.0, creatinine 0.71, hemoglobin 13.1, WBC 10.3  Family Communication: Pt in room, family currently at bedside  Disposition: Status is: Inpatient Remains inpatient appropriate because: Severity of illness  Planned Discharge Destination: Home    Author: Marylu Lund, MD 10/08/2021 2:40 PM  For on call review [email or web address].

## 2021-10-08 NOTE — Progress Notes (Signed)
Speech Language Pathology Treatment: Dysphagia  Patient Details Name: Kayla Pace MRN: [id number] DOB: February 16, 1922 Today's Date: 10/08/2021 Time: 3220-2542 SLP Time Calculation (min) (ACUTE ONLY): 20 min  Assessment / Plan / Recommendation Clinical Impression  Patient seen by SLP for skilled treatment session focused on dysphagia goals and education with daughter who was present in room. Patient with persistent dry, hoarse sounding, non-productive cough which was occurring in absence of any PO's. Daughter reported it has been going on for past 3 weeks. Daughter has not observed any swallowing difficulties when patient eating/drinking. SLP observed patient with several straw sips of soda and no overt s/s aspiration or penetration observed. Frequency and intensity of cough response that was observed prior to PO intake did not change. SLP is recommending to continue on Dys 3 solids, thin liquids diet and no further intervention recommended at acute care level. SLP is recommending f/u SLP eval and treatment when patient returns to her SNF.    HPI HPI: Patient is a 88 y.o. female with PMH: GERD, chronic diastolic heart failure, HTN, anxiety, tremors who presented from SNF to [facility] ED at the direction of her PCP for evaluation of elevated D-dimer checked as an outpatient. CXR showed patchy airspace disease in the right infrahilar lung and left base suggesting atelectasis versus minimal PNA; suspected hiatal hernia.      SLP Plan  Discharge SLP treatment due to (comment);All goals met      Recommendations for follow up therapy are one component of a multi-disciplinary discharge planning process, led by the attending physician.  Recommendations may be updated based on patient status, additional functional criteria and insurance authorization.    Recommendations  Diet recommendations: Dysphagia 3 (mechanical soft);Thin liquid Liquids provided via: Cup;Straw Medication Administration: Whole meds with  liquid Supervision: Full supervision/cueing for compensatory strategies;Staff to assist with self feeding Compensations: Minimize environmental distractions;Slow rate;Small sips/bites Postural Changes and/or Swallow Maneuvers: Seated upright 90 degrees                Oral Care Recommendations: Oral care BID;Staff/trained caregiver to provide oral care Follow Up Recommendations: Skilled nursing-short term rehab (<3 hours/day) Assistance recommended at discharge: Frequent or constant Supervision/Assistance SLP Visit Diagnosis: Dysphagia, unspecified (R13.10) Plan: Discharge SLP treatment due to (comment);All goals met          Sonia Baller, MA, CCC-SLP Speech Therapy

## 2021-10-09 DIAGNOSIS — J9601 Acute respiratory failure with hypoxia: Secondary | ICD-10-CM | POA: Diagnosis not present

## 2021-10-09 DIAGNOSIS — J40 Bronchitis, not specified as acute or chronic: Secondary | ICD-10-CM | POA: Diagnosis not present

## 2021-10-09 MED ORDER — AZITHROMYCIN 250 MG PO TABS
500.0000 mg | ORAL_TABLET | Freq: Every day | ORAL | Status: AC
  Administered 2021-10-09 – 2021-10-10 (×2): 500 mg via ORAL
  Filled 2021-10-09 (×2): qty 2

## 2021-10-09 NOTE — Progress Notes (Signed)
  Progress Note   Patient: Kayla Pace [id number]:[id number] DOB: [birth year] DOA: 10/05/2021     3 DOS: the patient was seen and examined on 10/09/2021   Brief hospital course: 88 y.o. female with medical history significant for chronic diastolic heart failure, who is admitted to [facility] on 10/05/2021 with community-acquired pneumonia after presenting from SNF to [facility] ED at the direction of her PCP for evaluation of elevated D-dimer checked as an outpatient.   Imaging with findings of L sided PNA, neg for PE  Assessment and Plan: CAP -Imaging reviewed with findings suggestive of L sided PNA -Cont on empiric rocephin and azithro -No leukocytosis. Afebrile -Continued on 3LNC, cont to wean as tolerated. Pt is O2 naive. Cont flutter valve as tolerated - Cont antitussive as needed -Cont chest PT as tolerated  Tremors -Seems stable this AM -On primidone PTA  GERD -Seems stable  HTN -BP stable and controlled at this time -Cont current regimen as tolerated  Chronic diastolic CHF -Appears euvoliemic on exam -Initially not tolerating much po and pt was continued on basal IVF -Holding further IVF and encourage PO intake  Pre-renal azotemia -Renal function normal with Cr 0.71 -Cr remains stable  Acute hypoxemic respiratory failure -Currently on 3LNC -Cont to wean O2 as tolerated with goal of RA  Anxiety -Appears to be stable at this time      Subjective: Pleasantly confused. Denies sob  Physical Exam: Vitals:   10/09/21 0553 10/09/21 0741 10/09/21 0946 10/09/21 1338  BP: (!) 160/79  (!) 162/69 (!) 152/76  Pulse: 85   91  Resp: 18   (!) 24  Temp: 98.6 F (37 C)   99 F (37.2 C)  TempSrc:    Oral  SpO2: 96% 92%  90%  Weight:      Height:       General exam: Awake, laying in bed, in nad Respiratory system: Normal respiratory effort, no wheezing Cardiovascular system: regular rate, s1, s2 Gastrointestinal system: Soft, nondistended, positive BS Central nervous  system: CN2-12 grossly intact, strength intact Extremities: Perfused, no clubbing Skin: Normal skin turgor, no notable skin lesions seen Psychiatry: Mood normal // no visual hallucinations   Data Reviewed:  There are no new results to review at this time.  Family Communication: Pt in room, family currently not at bedside  Disposition: Status is: Inpatient Remains inpatient appropriate because: Severity of illness  Planned Discharge Destination: Home    Author: Marylu Lund, MD 10/09/2021 3:21 PM  For on call review [email or web address].

## 2021-10-09 NOTE — Progress Notes (Signed)
Patient down to 1L oxygen, sats in the low 90s on 1L, on room air at rest down to the 80s, kept patient on 1L, will continue to monitor.

## 2021-10-10 DIAGNOSIS — J189 Pneumonia, unspecified organism: Secondary | ICD-10-CM | POA: Diagnosis not present

## 2021-10-10 DIAGNOSIS — N19 Unspecified kidney failure: Secondary | ICD-10-CM | POA: Diagnosis not present

## 2021-10-10 DIAGNOSIS — J9601 Acute respiratory failure with hypoxia: Secondary | ICD-10-CM | POA: Diagnosis not present

## 2021-10-10 DIAGNOSIS — F419 Anxiety disorder, unspecified: Secondary | ICD-10-CM | POA: Diagnosis not present

## 2021-10-10 LAB — COMPREHENSIVE METABOLIC PANEL
ALT: 39 U/L (ref 0–44)
AST: 24 U/L (ref 15–41)
Albumin: 2.6 g/dL — ABNORMAL LOW (ref 3.5–5.0)
Alkaline Phosphatase: 75 U/L (ref 38–126)
Anion gap: 8 (ref 5–15)
BUN: 8 mg/dL (ref 8–23)
CO2: 26 mmol/L (ref 22–32)
Calcium: 8.6 mg/dL — ABNORMAL LOW (ref 8.9–10.3)
Chloride: 102 mmol/L (ref 98–111)
Creatinine, Ser: 0.69 mg/dL (ref 0.44–1.00)
GFR, Estimated: >60 mL/min (ref >60)
Glucose, Bld: 95 mg/dL (ref 70–99)
Potassium: 3.9 mmol/L (ref 3.5–5.1)
Sodium: 136 mmol/L (ref 135–145)
Total Bilirubin: 0.6 mg/dL (ref 0.3–1.2)
Total Protein: 6.4 g/dL — ABNORMAL LOW (ref 6.5–8.1)

## 2021-10-10 LAB — CBC
HCT: 42 % (ref 36.0–46.0)
Hemoglobin: 13.4 g/dL (ref 12.0–15.0)
MCH: 32.3 pg (ref 26.0–34.0)
MCHC: 31.9 g/dL (ref 30.0–36.0)
MCV: 101.2 fL — ABNORMAL HIGH (ref 80.0–100.0)
Platelets: 188 10*3/uL (ref 150–400)
RBC: 4.15 MIL/uL (ref 3.87–5.11)
RDW: 13.1 % (ref 11.5–15.5)
WBC: 10 10*3/uL (ref 4.0–10.5)
nRBC: 0 % (ref 0.0–0.2)

## 2021-10-10 LAB — BRAIN NATRIURETIC PEPTIDE: B Natriuretic Peptide: 147.6 pg/mL — ABNORMAL HIGH (ref 0.0–100.0)

## 2021-10-10 MED ORDER — TRAZODONE HCL 50 MG PO TABS: 50.0000 mg | ORAL_TABLET | Freq: Every evening | ORAL | Status: DC | PRN

## 2021-10-10 MED ORDER — SENNOSIDES-DOCUSATE SODIUM 8.6-50 MG PO TABS: 1.0000 | ORAL_TABLET | Freq: Every evening | ORAL | Status: DC | PRN

## 2021-10-10 MED ORDER — BUDESONIDE 0.5 MG/2ML IN SUSP
0.5000 mg | Freq: Two times a day (BID) | RESPIRATORY_TRACT | Status: DC
  Administered 2021-10-10 – 2021-10-11 (×4): 0.5 mg via RESPIRATORY_TRACT
  Filled 2021-10-10 (×4): qty 2

## 2021-10-10 MED ORDER — HYDRALAZINE HCL 20 MG/ML IJ SOLN: 10.0000 mg | INTRAMUSCULAR | Status: DC | PRN

## 2021-10-10 MED ORDER — DM-GUAIFENESIN ER 30-600 MG PO TB12
1.0000 | ORAL_TABLET | Freq: Two times a day (BID) | ORAL | Status: DC
  Administered 2021-10-10 – 2021-10-11 (×3): 1 via ORAL
  Filled 2021-10-10 (×3): qty 1

## 2021-10-10 MED ORDER — METOPROLOL TARTRATE 5 MG/5ML IV SOLN: 5.0000 mg | INTRAVENOUS | Status: DC | PRN

## 2021-10-10 NOTE — Progress Notes (Signed)
PT Cancellation Note / Screen  Patient Details Name: Kayla Pace MRN: [id number] DOB: [birth year]   Cancelled Treatment:    Reason Eval/Treat Not Completed: PT screened, no needs identified, will sign off  Physical Therapy evaluation completed on 6/5 with patient at baseline for ADLs and mobility at this time. Please refer to this eval for more information. Patient to d/c back to SNF for long term care.   Kati L Payson 10/10/2021, 11:41 AM Arlyce Dice, DPT Acute Rehabilitation Services Pager: [phone] Office: [phone]

## 2021-10-10 NOTE — Progress Notes (Signed)
OT Cancellation Note  Patient Details Name: [name] MRN: [id number] DOB: [birth year]   Cancelled Treatment:    Reason Eval/Treat Not Completed: OT screened, no needs identified, will sign off OT eval completed on 6/5 with patient at baseline for ADLs at this time.please see this eval for more information. patient to d/c back to SNF for long term care.   Jackelyn Poling OTR/L, [place] Acute Rehabilitation Department Office# [phone] Pager# [phone]   10/10/2021, 10:45 AM

## 2021-10-10 NOTE — Care Management Important Message (Signed)
Important Message  Patient Details IM Letter placed in Patients room. Name: Kayla Pace MRN: [id number] Date of Birth: [birth year]   Medicare Important Message Given:  Yes     Kerin Salen 10/10/2021, 1:49 PM

## 2021-10-10 NOTE — Progress Notes (Signed)
PROGRESS NOTE    Kayla Pace  QVZ:[id number] DOB: [birth year] DOA: 10/05/2021 PCP: Valera Castle, MD   Brief Narrative:  88 y.o. female with medical history significant for chronic diastolic heart failure, who is admitted to [facility] on 10/05/2021 with community-acquired pneumonia after presenting from SNF to [facility] ED at the direction of her PCP for evaluation of elevated D-dimer checked as an outpatient.    Assessment & Plan:  Principal Problem:   CAP (community acquired pneumonia) Active Problems:   Anxiety and depression   Acute respiratory failure with hypoxia (HCC)   Acute prerenal azotemia   Chronic diastolic CHF (congestive heart failure) (HCC)   Essential hypertension   GERD (gastroesophageal reflux disease)   Tremors of nervous system     Assessment and Plan:  CAP, left lower lobe -Had elevated D-dimer.  CTA chest is negative for PE.  Does show left lower lobe pneumonia with some mucous plugging - Aggressive bronchodilators, scheduled and as needed.  Antitussives.  PT/OT. IS/Flutter.  - Antibiotics Rocephin and azithromycin. Complete 5 day course.   Urinary tract infection - Urine cultures not sent.  Already on IV Rocephin and azithromycin.  Continue to monitor.  Acute metabolic encephalopathy - Suspect from underlying infection.  Acute hypoxemic respiratory failure -Secondary to underlying pneumonia.  Aggressive bronchodilators.  I-S/flutter valve.  Out of bed to chair.  Tremors -On primidone 250 mg twice daily   GERD -Seems stable   HTN -Norvasc 5 mg daily.  IV as needed ordered.   Chronic diastolic CHF -Appears to be euvolemic.  Hold off on IV fluids.  We will check BNP.   Pre-renal azotemia -Continue to monitor  Anxiety -On Lexapro and Risperdal    DVT prophylaxis: SCDs Start: 10/06/21 0024 Code Status: DNR Family Communication:  daughter Updated.   Status is: Inpatient Remains inpatient appropriate because: Still hypoxic  with abnormal BS.    Subjective: Sitting up. Alert to name and place. Still congested and coughing.   Examination:  General exam: Appears calm and comfortable. Elderly frail.  Respiratory system: b/l diffuse rhonchi.  Cardiovascular system: S1 & S2 heard, RRR. No JVD, murmurs, rubs, gallops or clicks. No pedal edema. Gastrointestinal system: Abdomen is nondistended, soft and nontender. No organomegaly or masses felt. Normal bowel sounds heard. Central nervous system: Alert and oriented x 2. No focal neurological deficits. Extremities: Symmetric 4 x 5 power. Skin: No rashes, lesions or ulcers Psychiatry: Alert to name and place.   Objective: Vitals:   10/09/21 1552 10/09/21 2054 10/10/21 0512 10/10/21 0822  BP:  (!) 151/66 115/72   Pulse:  96 83   Resp:  18 20   Temp:  99.4 F (37.4 C) 99 F (37.2 C)   TempSrc:  Oral Oral   SpO2: 92% 91% 98% 97%  Weight:      Height:        Intake/Output Summary (Last 24 hours) at 10/10/2021 0922 Last data filed at 10/10/2021 0015 Gross per 24 hour  Intake 360 ml  Output 200 ml  Net 160 ml   Filed Weights   10/05/21 2143 10/06/21 0500 10/08/21 0436  Weight: 48.5 kg 67.4 kg 69.8 kg     Data Reviewed:   CBC: Recent Labs  Lab 10/05/21 2225 10/06/21 0530 10/07/21 0510 10/08/21 0438 10/10/21 0434  WBC 8.3 9.0 11.6* 10.3 10.0  NEUTROABS 5.8 5.4  --   --   --   HGB 14.2 14.6 13.4 13.1 13.4  HCT 43.4 46.0 42.1  41.9 42.0  MCV 98.2 100.0 101.0* 102.9* 101.2*  PLT 251 248 270 246 631   Basic Metabolic Panel: Recent Labs  Lab 10/05/21 2225 10/06/21 0530 10/07/21 0510 10/08/21 0438 10/10/21 0434  NA 138 138 141 143 136  K 4.5 4.2 4.0 4.0 3.9  CL 105 104 107 111 102  CO2 '27 27 28 27 26  '$ GLUCOSE 115* 98 117* 117* 95  BUN '22 18 13 8 8  '$ CREATININE 0.81 0.68 0.71 0.71 0.69  CALCIUM 8.9 8.8* 8.6* 8.7* 8.6*  MG 2.3 2.3  --   --   --   PHOS  --  3.5  --   --   --    GFR: Estimated Creatinine Clearance: 33.3 mL/min (by C-G  formula based on SCr of 0.69 mg/dL). Liver Function Tests: Recent Labs  Lab 10/06/21 0530 10/07/21 0510 10/08/21 0438 10/10/21 0434  AST 26 35 37 24  ALT 58* 61* 59* 39  ALKPHOS 84 82 85 75  BILITOT 0.6 0.5 0.6 0.6  PROT 7.3 6.6 6.8 6.4*  ALBUMIN 3.2* 2.8* 2.9* 2.6*   No results for input(s): LIPASE, AMYLASE in the last 168 hours. No results for input(s): AMMONIA in the last 168 hours. Coagulation Profile: No results for input(s): INR, PROTIME in the last 168 hours. Cardiac Enzymes: No results for input(s): CKTOTAL, CKMB, CKMBINDEX, TROPONINI in the last 168 hours. BNP (last 3 results) No results for input(s): PROBNP in the last 8760 hours. HbA1C: No results for input(s): HGBA1C in the last 72 hours. CBG: No results for input(s): GLUCAP in the last 168 hours. Lipid Profile: No results for input(s): CHOL, HDL, LDLCALC, TRIG, CHOLHDL, LDLDIRECT in the last 72 hours. Thyroid Function Tests: No results for input(s): TSH, T4TOTAL, FREET4, T3FREE, THYROIDAB in the last 72 hours. Anemia Panel: No results for input(s): VITAMINB12, FOLATE, FERRITIN, TIBC, IRON, RETICCTPCT in the last 72 hours. Sepsis Labs: Recent Labs  Lab 10/05/21 2225 10/05/21 2310  PROCALCITON <0.10  --   LATICACIDVEN  --  0.7    Recent Results (from the past 240 hour(s))  Resp Panel by RT-PCR (Flu A&B, Covid) Anterior Nasal Swab     Status: None   Collection Time: 10/05/21 10:25 PM   Specimen: Anterior Nasal Swab  Result Value Ref Range Status   SARS Coronavirus 2 by RT PCR NEGATIVE NEGATIVE Final    Comment: (NOTE) SARS-CoV-2 target nucleic acids are NOT DETECTED.  The SARS-CoV-2 RNA is generally detectable in upper respiratory specimens during the acute phase of infection. The lowest concentration of SARS-CoV-2 viral copies this assay can detect is 138 copies/mL. A negative result does not preclude SARS-Cov-2 infection and should not be used as the sole basis for treatment or other patient  management decisions. A negative result may occur with  improper specimen collection/handling, submission of specimen other than nasopharyngeal swab, presence of viral mutation(s) within the areas targeted by this assay, and inadequate number of viral copies(<138 copies/mL). A negative result must be combined with clinical observations, patient history, and epidemiological information. The expected result is Negative.  Fact Sheet for Patients:  EntrepreneurPulse.com.au  Fact Sheet for Healthcare Providers:  IncredibleEmployment.be  This test is no t yet approved or cleared by the [place] FDA and  has been authorized for detection and/or diagnosis of SARS-CoV-2 by FDA under an Emergency Use Authorization (EUA). This EUA will remain  in effect (meaning this test can be used) for the duration of the COVID-19 declaration under Section 564(b)(1) of  the Act, 21 U.S.C.section 360bbb-3(b)(1), unless the authorization is terminated  or revoked sooner.       Influenza A by PCR NEGATIVE NEGATIVE Final   Influenza B by PCR NEGATIVE NEGATIVE Final    Comment: (NOTE) The Xpert Xpress SARS-CoV-2/FLU/RSV plus assay is intended as an aid in the diagnosis of influenza from Nasopharyngeal swab specimens and should not be used as a sole basis for treatment. Nasal washings and aspirates are unacceptable for Xpert Xpress SARS-CoV-2/FLU/RSV testing.  Fact Sheet for Patients: EntrepreneurPulse.com.au  Fact Sheet for Healthcare Providers: IncredibleEmployment.be  This test is not yet approved or cleared by the [place] FDA and has been authorized for detection and/or diagnosis of SARS-CoV-2 by FDA under an Emergency Use Authorization (EUA). This EUA will remain in effect (meaning this test can be used) for the duration of the COVID-19 declaration under Section 564(b)(1) of the Act, 21 U.S.C. section 360bbb-3(b)(1),  unless the authorization is terminated or revoked.  Performed at [facility], [place] [address], [place], [place] [address]   Culture, blood (routine x 2)     Status: None (Preliminary result)   Collection Time: 10/05/21 10:55 PM   Specimen: BLOOD  Result Value Ref Range Status   Specimen Description   Final    BLOOD LEFT ANTECUBITAL Performed at [place] [address] [place], [place] [address]    Special Requests   Final    BOTTLES DRAWN AEROBIC AND ANAEROBIC Blood Culture adequate volume Performed at [facility] [address], [place], [place] [address]    Culture   Final    NO GROWTH 3 DAYS Performed at [facility], [place] [address] [place], [place] [address]    Report Status PENDING  Incomplete  Culture, blood (routine x 2)     Status: None (Preliminary result)   Collection Time: 10/05/21 11:20 PM   Specimen: BLOOD  Result Value Ref Range Status   Specimen Description   Final    BLOOD SITE NOT SPECIFIED Performed at [facility] [address], [place], [place] [address]    Special Requests   Final    BOTTLES DRAWN AEROBIC AND ANAEROBIC Blood Culture adequate volume Performed at [place] [address] [place], [place] [address]    Culture   Final    NO GROWTH 3 DAYS Performed at [facility], [place] [address] [place], [place] [address]    Report Status PENDING  Incomplete  MRSA Next Gen by PCR, Nasal     Status: None   Collection Time: 10/06/21  2:38 AM   Specimen: Nasal Mucosa; Nasal Swab  Result Value Ref Range Status   MRSA by PCR Next Gen NOT DETECTED NOT DETECTED Final    Comment: (NOTE) The GeneXpert MRSA Assay (FDA approved for NASAL specimens only), is one component of a comprehensive MRSA colonization surveillance program. It is not intended to diagnose MRSA infection nor to guide or monitor treatment for MRSA infections. Test  performance is not FDA approved in patients less than 37 years old. Performed at [facility], [place] [address], [place], [place] [address]          Radiology Studies: No results found.      Scheduled Meds:  amLODipine  5 mg Oral Daily   azithromycin  500 mg Oral QHS   escitalopram  10 mg Oral Daily   ipratropium-albuterol  3 mL Nebulization TID  mouth rinse  15 mL Mouth Rinse BID   memantine  10 mg Oral BID   pantoprazole  40 mg Oral Daily   primidone  250 mg Oral BID   risperiDONE  0.25 mg Oral Daily   risperiDONE  0.5 mg Oral QHS   senna  1 tablet Oral QHS   Continuous Infusions:  cefTRIAXone (ROCEPHIN)  IV 1 g (10/09/21 2310)     LOS: 4 days   Time spent= 35 mins    Marisol Giambra Arsenio Loader, MD [facility]  If 7PM-7AM, please contact night-coverage  10/10/2021, 9:22 AM

## 2021-10-10 NOTE — Plan of Care (Signed)
  Problem: Clinical Measurements: Goal: Diagnostic test results will improve Outcome: Progressing Goal: Respiratory complications will improve Outcome: Progressing   Problem: Safety: Goal: Ability to remain free from injury will improve Outcome: Progressing   Problem: Respiratory: Goal: Ability to maintain adequate ventilation will improve Outcome: Progressing

## 2021-10-11 DIAGNOSIS — F419 Anxiety disorder, unspecified: Secondary | ICD-10-CM | POA: Diagnosis not present

## 2021-10-11 DIAGNOSIS — I5032 Chronic diastolic (congestive) heart failure: Secondary | ICD-10-CM | POA: Diagnosis not present

## 2021-10-11 DIAGNOSIS — J9601 Acute respiratory failure with hypoxia: Secondary | ICD-10-CM | POA: Diagnosis not present

## 2021-10-11 DIAGNOSIS — J189 Pneumonia, unspecified organism: Secondary | ICD-10-CM | POA: Diagnosis not present

## 2021-10-11 LAB — CULTURE, BLOOD (ROUTINE X 2)
Culture: NO GROWTH
Culture: NO GROWTH
Special Requests: ADEQUATE
Special Requests: ADEQUATE

## 2021-10-11 LAB — CBC
HCT: 43.8 % (ref 36.0–46.0)
Hemoglobin: 14.1 g/dL (ref 12.0–15.0)
MCH: 32 pg (ref 26.0–34.0)
MCHC: 32.2 g/dL (ref 30.0–36.0)
MCV: 99.5 fL (ref 80.0–100.0)
Platelets: 264 10*3/uL (ref 150–400)
RBC: 4.4 MIL/uL (ref 3.87–5.11)
RDW: 13 % (ref 11.5–15.5)
WBC: 9.5 10*3/uL (ref 4.0–10.5)
nRBC: 0 % (ref 0.0–0.2)

## 2021-10-11 LAB — BASIC METABOLIC PANEL
Anion gap: 12 (ref 5–15)
BUN: 12 mg/dL (ref 8–23)
CO2: 28 mmol/L (ref 22–32)
Calcium: 8.9 mg/dL (ref 8.9–10.3)
Chloride: 97 mmol/L — ABNORMAL LOW (ref 98–111)
Creatinine, Ser: 0.9 mg/dL (ref 0.44–1.00)
GFR, Estimated: 58 mL/min — ABNORMAL LOW (ref >60)
Glucose, Bld: 82 mg/dL (ref 70–99)
Potassium: 4 mmol/L (ref 3.5–5.1)
Sodium: 137 mmol/L (ref 135–145)

## 2021-10-11 LAB — PROCALCITONIN: Procalcitonin: <0.1 ng/mL

## 2021-10-11 MED ORDER — DM-GUAIFENESIN ER 30-600 MG PO TB12
1.0000 | ORAL_TABLET | Freq: Two times a day (BID) | ORAL | 0 refills | Status: AC
Start: 2021-10-11 — End: 2021-10-18

## 2021-10-11 MED ORDER — ALBUTEROL SULFATE HFA 108 (90 BASE) MCG/ACT IN AERS: 2.0000 | INHALATION_SPRAY | Freq: Four times a day (QID) | RESPIRATORY_TRACT | 2 refills | Status: AC | PRN

## 2021-10-11 NOTE — NC FL2 (Signed)
[place] LEVEL OF CARE SCREENING TOOL     IDENTIFICATION  Patient Name: Kayla Pace Birthdate: 1922-04-20 Sex: female Admission Date (Current Location): 10/05/2021  [facility] and [place] Number:      Facility and Address:         Provider Number: [id number]  Attending Physician Name and Address:  Damita Lack, MD  Relative Name and Phone Number:  Tarri Abernethy [phone]    Current Level of Care: Hospital Recommended Level of Care:  ([facility]) Prior Approval Number:    Date Approved/Denied:   PASRR Number:    Discharge Plan: Other (Comment) [facility]  [phone])    Current Diagnoses: Patient Active Problem List   Diagnosis Date Noted   CAP (community acquired pneumonia) 10/06/2021   Acute respiratory failure with hypoxia ([place]) 10/06/2021   Acute prerenal azotemia 10/06/2021   Chronic diastolic CHF (congestive heart failure) ([place]) 10/06/2021   Essential hypertension 10/06/2021   GERD (gastroesophageal reflux disease) 10/06/2021   Tremors of nervous system 10/06/2021   Acute lower UTI 05/26/2018   Failure of outpatient treatment 05/26/2018   Anxiety and depression 05/26/2018   Dementia without behavioral disturbance ([place]) 05/26/2018   Dyslipidemia 05/26/2018   H/O colon cancer, stage I 05/26/2018   H/O CHF 05/26/2018   UTI (urinary tract infection) 05/26/2018   Failure to thrive in adult 12/07/2015    Orientation RESPIRATION BLADDER Height & Weight     Self  O2 Incontinent Weight: 69.8 kg Height:  '4\' 11"'$  (149.9 cm)  BEHAVIORAL SYMPTOMS/MOOD NEUROLOGICAL BOWEL NUTRITION STATUS      Incontinent Diet (Mechanical soft)  AMBULATORY STATUS COMMUNICATION OF NEEDS Skin   Extensive Assist Verbally Normal                       Personal Care Assistance Level of Assistance  Bathing, Feeding, Dressing Bathing Assistance: Limited assistance Feeding assistance: Limited assistance Dressing  Assistance: Limited assistance     Functional Limitations Info  Sight, Hearing, Speech Sight Info: Adequate Hearing Info: Adequate Speech Info: Adequate    SPECIAL CARE FACTORS FREQUENCY                       Contractures Contractures Info: Not present    Additional Factors Info  Code Status, Allergies Code Status Info:  (DNR) Allergies Info:  (Codeine,morphine)           Current Medications (10/11/2021):  This is the current hospital active medication list Current Facility-Administered Medications  Medication Dose Route Frequency Provider Last Rate Last Admin   acetaminophen (TYLENOL) tablet 650 mg  650 mg Oral Q6H PRN Howerter, Justin B, DO   650 mg at 10/10/21 1820   Or   acetaminophen (TYLENOL) suppository 650 mg  650 mg Rectal Q6H PRN Howerter, Justin B, DO       albuterol (PROVENTIL) (2.5 MG/3ML) 0.083% nebulizer solution 2.5 mg  2.5 mg Nebulization Q4H PRN Howerter, Justin B, DO   2.5 mg at 10/06/21 1833   amLODipine (NORVASC) tablet 5 mg  5 mg Oral Daily Howerter, Justin B, DO   5 mg at 10/11/21 0848   budesonide (PULMICORT) nebulizer solution 0.5 mg  0.5 mg Nebulization BID Amin, Ankit Chirag, MD   0.5 mg at 10/10/21 1940   chlorpheniramine-HYDROcodone 10-8 MG/5ML suspension 5 mL  5 mL Oral Q12H PRN Donne [place], MD   5 mL at 10/10/21 1534  dextromethorphan-guaiFENesin ([place] DM) 30-600 MG per 12 hr tablet 1 tablet  1 tablet Oral BID Amin, Jeanella Flattery, MD   1 tablet at 10/11/21 0850   escitalopram (LEXAPRO) tablet 10 mg  10 mg Oral Daily Howerter, Justin B, DO   10 mg at 10/11/21 0849   guaiFENesin (ROBITUSSIN) 100 MG/5ML liquid 5 mL  5 mL Oral Q4H PRN Howerter, Justin B, DO   5 mL at 10/11/21 0848   hydrALAZINE (APRESOLINE) injection 10 mg  10 mg Intravenous Q4H PRN Amin, Ankit Chirag, MD       ipratropium-albuterol (DUONEB) 0.5-2.5 (3) MG/3ML nebulizer solution 3 mL  3 mL Nebulization TID Donne [place], MD   3 mL at 10/10/21 1940   MEDLINE mouth rinse   15 mL Mouth Rinse BID Howerter, Justin B, DO   15 mL at 10/11/21 0850   memantine (NAMENDA) tablet 10 mg  10 mg Oral BID Howerter, Justin B, DO   10 mg at 10/11/21 0849   metoprolol tartrate (LOPRESSOR) injection 5 mg  5 mg Intravenous Q4H PRN Amin, Ankit Chirag, MD       pantoprazole (PROTONIX) EC tablet 40 mg  40 mg Oral Daily Howerter, Justin B, DO   40 mg at 10/11/21 0849   primidone (MYSOLINE) tablet 250 mg  250 mg Oral BID Howerter, Justin B, DO   250 mg at 10/11/21 1607   risperiDONE (RISPERDAL) tablet 0.25 mg  0.25 mg Oral Daily Howerter, Justin B, DO   0.25 mg at 10/11/21 0848   risperiDONE (RISPERDAL) tablet 0.5 mg  0.5 mg Oral QHS Howerter, Justin B, DO   0.5 mg at 10/10/21 2132   senna (SENOKOT) tablet 8.6 mg  1 tablet Oral QHS Howerter, Justin B, DO   8.6 mg at 10/10/21 2132   senna-docusate (Senokot-S) tablet 1 tablet  1 tablet Oral QHS PRN Amin, Ankit Chirag, MD       traZODone (DESYREL) tablet 50 mg  50 mg Oral QHS PRN Amin, Jeanella Flattery, MD         Discharge Medications: Please see discharge summary for a list of discharge medications.  Relevant Imaging Results:  Relevant Lab Results:   Additional Information SS# [id number]  Dessa Phi, RN

## 2021-10-11 NOTE — Discharge Summary (Addendum)
Physician Discharge Summary  Kayla Pace KGM:[id number] DOB: [birth year] DOA: 10/05/2021  PCP: Valera Castle, MD  Admit date: 10/05/2021 Discharge date: 10/11/2021  Admitted From: [facility] Disposition:  SNF  Recommendations for Outpatient Follow-up:  Follow up with PCP in 1-2 weeks Please obtain BMP/CBC in one week your next doctors visit.  Albuterol inhaler has been prescribed.  Initially can be used every 4-6 hours thereafter over next 3-5 days transition to as needed Repeat chest x-ray in 6 weeks Completed antibiotic course in the hospital Supplemental oxygen 2 L nasal cannula.  Wean off as appropriate in next several days Mucinex twice daily for 7 more days thereafter as needed   Discharge Condition: Stable CODE STATUS: DNR Diet recommendation: Heart healthy - Mechanical Soft  Brief/Interim Summary: 88 y.o. female with medical history significant for chronic diastolic heart failure, who is admitted to [facility] on 10/05/2021 with community-acquired pneumonia after presenting from SNF to [facility] ED at the direction of her PCP for evaluation of elevated D-dimer checked as an outpatient.  Upon admission patient was found to have left lower lobe pneumonia, D-dimer was elevated but CTA chest was negative for pulmonary embolism.  Patient was started on IV Rocephin and azithromycin, she completed 5-day course treatment for this.  There was concerns of urinary tract infection but cultures were never sent.  Either way patient was covered with IV Rocephin.  Over the course of several days her mentation improved.  Her oxygenation also improved with bronchodilators and pneumonia treatment.  On the day of discharge she was on 2 L nasal cannula and was advised to be weaned off at the rehab facility over several days. Rest the recommendations as stated above Also spoke with patient's daughter extensively with instructions on the day of discharge as well.     Assessment & Plan:  Principal  Problem:   CAP (community acquired pneumonia) Active Problems:   Anxiety and depression   Acute respiratory failure with hypoxia (HCC)   Acute prerenal azotemia   Chronic diastolic CHF (congestive heart failure) (HCC)   Essential hypertension   GERD (gastroesophageal reflux disease)   Tremors of nervous system       Assessment and Plan:   CAP, left lower lobe -Had elevated D-dimer.  CTA chest is negative for PE.  Does show left lower lobe pneumonia with some mucous plugging.  Advised to continue Mucinex twice daily, bronchodilators scheduled and as needed.  Aggressive use of incentive spirometer and flutter valve. - Completed 5-day course of azithromycin and Rocephin.   Urinary tract infection - Urine cultures not sent.  Did already receive IV Rocephin for pneumonia.   Acute metabolic encephalopathy - Suspect from underlying infection.   Acute hypoxemic respiratory failure -Secondary to underlying pneumonia.  Significantly improved, aggressive use of bronchodilators, incentive spirometer and flutter valve.  Currently on 2 L nasal cannula which can be weaned off for next several days.  Tremors -On primidone 250 mg twice daily   GERD -Seems stable   HTN -Norvasc 5 mg daily.     Chronic diastolic CHF - Appears to be euvolemic   Pre-renal azotemia -Continue to monitor   Anxiety -On Lexapro and Risperdal     Body mass index is 31.08 kg/m.       Discharge Diagnoses:  Principal Problem:   CAP (community acquired pneumonia) Active Problems:   Anxiety and depression   Acute respiratory failure with hypoxia (HCC)   Acute prerenal azotemia   Chronic diastolic CHF (congestive heart  failure) ([place])   Essential hypertension   GERD (gastroesophageal reflux disease)   Tremors of nervous system      Consultations: None  Subjective: Patient states she is feeling better compared to yesterday.  Her breathing is slightly better, cough is stronger.  Appetite is  somewhat poor but coming back.  Discharge Exam: Vitals:   10/10/21 2031 10/11/21 0514  BP: 132/66 133/73  Pulse: 78 85  Resp: 18 18  Temp: 98.6 F (37 C) 98.7 F (37.1 C)  SpO2: 96% 96%   Vitals:   10/10/21 1532 10/10/21 1940 10/10/21 2031 10/11/21 0514  BP: (!) 156/86  132/66 133/73  Pulse: 100  78 85  Resp: (!) '22  18 18  '$ Temp: 98.4 F (36.9 C)  98.6 F (37 C) 98.7 F (37.1 C)  TempSrc: Oral  Oral Oral  SpO2: 91% 93% 96% 96%  Weight:      Height:        General: Pt is alert, awake, not in acute distress, elderly frail. 2L [place] Cardiovascular: RRR, S1/S2 +, no rubs, no gallops Respiratory: Bilateral rhonchi but improved compared to yesterday Abdominal: Soft, NT, ND, bowel sounds + Extremities: no edema, no cyanosis  Discharge Instructions   Allergies as of 10/11/2021       Reactions   Codeine Nausea And Vomiting   Morphine Other (See Comments)   Hallucinations        Medication List     TAKE these medications    acetaminophen 500 MG tablet Commonly known as: TYLENOL Take 500 mg by mouth every 6 (six) hours as needed for moderate pain.   albuterol 108 (90 Base) MCG/ACT inhaler Commonly known as: VENTOLIN HFA Inhale 2 puffs into the lungs every 6 (six) hours as needed for wheezing or shortness of breath.   amLODipine 5 MG tablet Commonly known as: NORVASC Take 5 mg by mouth daily.   Artificial Tears 1 % ophthalmic solution Generic drug: carboxymethylcellulose Apply 1 drop to eye every 6 (six) hours.   betamethasone valerate 0.1 % cream Commonly known as: VALISONE Apply topically 2 (two) times daily.   carbamide peroxide 6.5 % OTIC solution Commonly known as: DEBROX Place 3 drops into both ears 2 (two) times daily.   carvedilol 3.125 MG tablet Commonly known as: COREG Take 3.125 mg by mouth daily.   Centrum Silver Adult 50+ Tabs Take 1 tablet by mouth daily.   dextromethorphan-guaiFENesin 30-600 MG 12hr tablet Commonly known as: MUCINEX  DM Take 1 tablet by mouth 2 (two) times daily for 7 days.   diclofenac Sodium 1 % Gel Commonly known as: VOLTAREN Apply 1 application. topically 4 (four) times daily.   docusate sodium 100 MG capsule Commonly known as: COLACE Take 1 capsule (100 mg total) by mouth 2 (two) times daily.   escitalopram 10 MG tablet Commonly known as: LEXAPRO Take 10 mg by mouth daily.   guaiFENesin 100 MG/5ML liquid Commonly known as: ROBITUSSIN Take 5 mLs by mouth every 4 (four) hours as needed for cough or to loosen phlegm.   ipratropium 0.03 % nasal spray Commonly known as: ATROVENT Place 2 sprays into both nostrils every 8 (eight) hours.   memantine 10 MG tablet Commonly known as: NAMENDA Take 10 mg by mouth 2 (two) times daily.   omeprazole 20 MG capsule Commonly known as: PRILOSEC Take 20 mg by mouth daily.   predniSONE 20 MG tablet Commonly known as: DELTASONE Take 20 mg by mouth daily with breakfast.   primidone  250 MG tablet Commonly known as: MYSOLINE Take 250 mg by mouth 2 (two) times daily.   risperiDONE 0.5 MG tablet Commonly known as: RISPERDAL Take 0.5 mg by mouth at bedtime.   risperiDONE 0.25 MG tablet Commonly known as: RISPERDAL Take 0.25 mg by mouth daily.   senna 8.6 MG Tabs tablet Commonly known as: SENOKOT Take 1 tablet (8.6 mg total) by mouth daily. What changed: when to take this   vitamin C 1000 MG tablet Take 1 tablet by mouth daily.   VITAMIN D PO Take 1 tablet by mouth daily.        Follow-up Information     Kym Groom Guy Begin, MD Follow up in 1 week(s).   Specialty: Family Medicine Contact information: [place] [address] [phone]                Allergies  Allergen Reactions   Codeine Nausea And Vomiting   Morphine Other (See Comments)    Hallucinations    You were cared for by a hospitalist during your hospital stay. If you have any questions about your discharge medications or the care you  received while you were in the hospital after you are discharged, you can call the unit and asked to speak with the hospitalist on call if the hospitalist that took care of you is not available. Once you are discharged, your primary care physician will handle any further medical issues. Please note that no refills for any discharge medications will be authorized once you are discharged, as it is imperative that you return to your primary care physician (or establish a relationship with a primary care physician if you do not have one) for your aftercare needs so that they can reassess your need for medications and monitor your lab values.   Procedures/Studies: CT Angio Chest PE W and/or Wo Contrast  Result Date: 10/05/2021 CLINICAL DATA:  Cough with positive D-dimer EXAM: CT ANGIOGRAPHY CHEST WITH CONTRAST TECHNIQUE: Multidetector CT imaging of the chest was performed using the standard protocol during bolus administration of intravenous contrast. Multiplanar CT image reconstructions and MIPs were obtained to evaluate the vascular anatomy. RADIATION DOSE REDUCTION: This exam was performed according to the departmental dose-optimization program which includes automated exposure control, adjustment of the mA and/or kV according to patient size and/or use of iterative reconstruction technique. CONTRAST:  10m OMNIPAQUE IOHEXOL 350 MG/ML SOLN COMPARISON:  None Available. FINDINGS: Cardiovascular: The aorta is diffusely ectatic without focal dilatation. There are atherosclerotic calcifications of the aorta. The heart is enlarged. There is no pericardial effusion. There is adequate opacification of the pulmonary arteries to the segmental level. There is no evidence for pulmonary embolism. Mediastinum/Nodes: No enlarged mediastinal, hilar, or axillary lymph nodes. Thyroid gland, trachea, and esophagus demonstrate no significant findings. Lungs/Pleura: There is mild atelectasis and airspace disease in the medial left  lower lobe. There is left lower lobe central peribronchial wall thickening. There is also right lower lobe peribronchial wall thickening with secretions seen in the bronchi. There is minimal right basilar atelectasis. There is a nodular density in the right middle lobe measuring 4 mm image 6/87. There is no pleural effusion or pneumothorax. Upper Abdomen: No acute abnormality. Musculoskeletal: No chest wall abnormality. No acute or significant osseous findings. Review of the MIP images confirms the above findings. IMPRESSION: 1. No evidence for pulmonary embolism. 2. Minimal patchy atelectasis and airspace disease in the left lower lobe with peribronchial wall thickening worrisome for infection. 3. There is  also peribronchial wall thickening and plugging of the bronchi involving the right lower lobe. Correlate clinically for aspiration. 4. Cardiomegaly. 5.  Aortic Atherosclerosis (ICD10-I70.0). Electronically Signed   By: Ronney Asters M.D.   On: 10/05/2021 23:37   DG Chest 2 View  Result Date: 10/05/2021 CLINICAL DATA:  Cough EXAM: CHEST - 2 VIEW COMPARISON:  None Available. FINDINGS: Patchy airspace disease at the left base. Mild right infrahilar and lower lung opacity. No pleural effusion. Suspect hiatal hernia. Normal cardiac size. Aortic atherosclerosis. IMPRESSION: 1. Patchy airspace disease in the right infrahilar lung and left base, atelectasis versus minimal pneumonia 2. Suspect hiatal hernia Electronically Signed   By: Donavan Foil M.D.   On: 10/05/2021 22:19     The results of significant diagnostics from this hospitalization (including imaging, microbiology, ancillary and laboratory) are listed below for reference.     Microbiology: Recent Results (from the past 240 hour(s))  Resp Panel by RT-PCR (Flu A&B, Covid) Anterior Nasal Swab     Status: None   Collection Time: 10/05/21 10:25 PM   Specimen: Anterior Nasal Swab  Result Value Ref Range Status   SARS Coronavirus 2 by RT PCR NEGATIVE  NEGATIVE Final    Comment: (NOTE) SARS-CoV-2 target nucleic acids are NOT DETECTED.  The SARS-CoV-2 RNA is generally detectable in upper respiratory specimens during the acute phase of infection. The lowest concentration of SARS-CoV-2 viral copies this assay can detect is 138 copies/mL. A negative result does not preclude SARS-Cov-2 infection and should not be used as the sole basis for treatment or other patient management decisions. A negative result may occur with  improper specimen collection/handling, submission of specimen other than nasopharyngeal swab, presence of viral mutation(s) within the areas targeted by this assay, and inadequate number of viral copies(<138 copies/mL). A negative result must be combined with clinical observations, patient history, and epidemiological information. The expected result is Negative.  Fact Sheet for Patients:  EntrepreneurPulse.com.au  Fact Sheet for Healthcare Providers:  IncredibleEmployment.be  This test is no t yet approved or cleared by the [place] FDA and  has been authorized for detection and/or diagnosis of SARS-CoV-2 by FDA under an Emergency Use Authorization (EUA). This EUA will remain  in effect (meaning this test can be used) for the duration of the COVID-19 declaration under Section 564(b)(1) of the Act, 21 U.S.C.section 360bbb-3(b)(1), unless the authorization is terminated  or revoked sooner.       Influenza A by PCR NEGATIVE NEGATIVE Final   Influenza B by PCR NEGATIVE NEGATIVE Final    Comment: (NOTE) The Xpert Xpress SARS-CoV-2/FLU/RSV plus assay is intended as an aid in the diagnosis of influenza from Nasopharyngeal swab specimens and should not be used as a sole basis for treatment. Nasal washings and aspirates are unacceptable for Xpert Xpress SARS-CoV-2/FLU/RSV testing.  Fact Sheet for Patients: EntrepreneurPulse.com.au  Fact Sheet for Healthcare  Providers: IncredibleEmployment.be  This test is not yet approved or cleared by the [place] FDA and has been authorized for detection and/or diagnosis of SARS-CoV-2 by FDA under an Emergency Use Authorization (EUA). This EUA will remain in effect (meaning this test can be used) for the duration of the COVID-19 declaration under Section 564(b)(1) of the Act, 21 U.S.C. section 360bbb-3(b)(1), unless the authorization is terminated or revoked.  Performed at [facility], [place] [address] [place], [place] [address]   Culture, blood (routine x 2)     Status: None (Preliminary result)   Collection Time: 10/05/21 10:55  PM   Specimen: BLOOD  Result Value Ref Range Status   Specimen Description   Final    BLOOD LEFT ANTECUBITAL Performed at [facility] [address] [place], [place] [address]    Special Requests   Final    BOTTLES DRAWN AEROBIC AND ANAEROBIC Blood Culture adequate volume Performed at [facility] [address], [place], [place] [address]    Culture   Final    NO GROWTH 4 DAYS Performed at [facility], [place] [address], [place], [place] [address]    Report Status PENDING  Incomplete  Culture, blood (routine x 2)     Status: None (Preliminary result)   Collection Time: 10/05/21 11:20 PM   Specimen: BLOOD  Result Value Ref Range Status   Specimen Description   Final    BLOOD SITE NOT SPECIFIED Performed at [facility] [address], [place], [place] [address]    Special Requests   Final    BOTTLES DRAWN AEROBIC AND ANAEROBIC Blood Culture adequate volume Performed at [facility] [address], [place], [place] [address]    Culture   Final    NO GROWTH 4 DAYS Performed at [facility], [place] [address], [place], [place] [address]    Report Status PENDING  Incomplete  MRSA Next Gen by PCR, Nasal     Status: None   Collection  Time: 10/06/21  2:38 AM   Specimen: Nasal Mucosa; Nasal Swab  Result Value Ref Range Status   MRSA by PCR Next Gen NOT DETECTED NOT DETECTED Final    Comment: (NOTE) The GeneXpert MRSA Assay (FDA approved for NASAL specimens only), is one component of a comprehensive MRSA colonization surveillance program. It is not intended to diagnose MRSA infection nor to guide or monitor treatment for MRSA infections. Test performance is not FDA approved in patients less than 66 years old. Performed at [facility], [place] [address], [place], [place] [address]      Labs: BNP (last 3 results) Recent Labs    10/05/21 2225 10/10/21 1048  BNP 55.0 700.1*   Basic Metabolic Panel: Recent Labs  Lab 10/05/21 2225 10/06/21 0530 10/07/21 0510 10/08/21 0438 10/10/21 0434 10/11/21 0501  NA 138 138 141 143 136 137  K 4.5 4.2 4.0 4.0 3.9 4.0  CL 105 104 107 111 102 97*  CO2 '27 27 28 27 26 28  '$ GLUCOSE 115* 98 117* 117* 95 82  BUN '22 18 13 8 8 12  '$ CREATININE 0.81 0.68 0.71 0.71 0.69 0.90  CALCIUM 8.9 8.8* 8.6* 8.7* 8.6* 8.9  MG 2.3 2.3  --   --   --   --   PHOS  --  3.5  --   --   --   --    Liver Function Tests: Recent Labs  Lab 10/06/21 0530 10/07/21 0510 10/08/21 0438 10/10/21 0434  AST 26 35 37 24  ALT 58* 61* 59* 39  ALKPHOS 84 82 85 75  BILITOT 0.6 0.5 0.6 0.6  PROT 7.3 6.6 6.8 6.4*  ALBUMIN 3.2* 2.8* 2.9* 2.6*   No results for input(s): "LIPASE", "AMYLASE" in the last 168 hours. No results for input(s): "AMMONIA" in the last 168 hours. CBC: Recent Labs  Lab 10/05/21 2225 10/06/21 0530 10/07/21 0510 10/08/21 0438 10/10/21 0434 10/11/21 0501  WBC 8.3 9.0 11.6* 10.3 10.0 9.5  NEUTROABS 5.8 5.4  --   --   --   --  HGB 14.2 14.6 13.4 13.1 13.4 14.1  HCT 43.4 46.0 42.1 41.9 42.0 43.8  MCV 98.2 100.0 101.0* 102.9* 101.2* 99.5  PLT 251 248 270 246 188 264   Cardiac Enzymes: No results for input(s): "CKTOTAL", "CKMB", "CKMBINDEX", "TROPONINI" in the last  168 hours. BNP: Invalid input(s): "POCBNP" CBG: No results for input(s): "GLUCAP" in the last 168 hours. D-Dimer No results for input(s): "DDIMER" in the last 72 hours. Hgb A1c No results for input(s): "HGBA1C" in the last 72 hours. Lipid Profile No results for input(s): "CHOL", "HDL", "LDLCALC", "TRIG", "CHOLHDL", "LDLDIRECT" in the last 72 hours. Thyroid function studies No results for input(s): "TSH", "T4TOTAL", "T3FREE", "THYROIDAB" in the last 72 hours.  Invalid input(s): "FREET3" Anemia work up No results for input(s): "VITAMINB12", "FOLATE", "FERRITIN", "TIBC", "IRON", "RETICCTPCT" in the last 72 hours. Urinalysis    Component Value Date/Time   COLORURINE YELLOW 10/06/2021 1834   APPEARANCEUR HAZY (A) 10/06/2021 1834   LABSPEC >1.046 (H) 10/06/2021 1834   PHURINE 5.0 10/06/2021 1834   GLUCOSEU NEGATIVE 10/06/2021 1834   HGBUR NEGATIVE 10/06/2021 1834   BILIRUBINUR NEGATIVE 10/06/2021 1834   KETONESUR 5 (A) 10/06/2021 1834   PROTEINUR NEGATIVE 10/06/2021 1834   NITRITE POSITIVE (A) 10/06/2021 1834   LEUKOCYTESUR LARGE (A) 10/06/2021 1834   Sepsis Labs Recent Labs  Lab 10/07/21 0510 10/08/21 0438 10/10/21 0434 10/11/21 0501  WBC 11.6* 10.3 10.0 9.5   Microbiology Recent Results (from the past 240 hour(s))  Resp Panel by RT-PCR (Flu A&B, Covid) Anterior Nasal Swab     Status: None   Collection Time: 10/05/21 10:25 PM   Specimen: Anterior Nasal Swab  Result Value Ref Range Status   SARS Coronavirus 2 by RT PCR NEGATIVE NEGATIVE Final    Comment: (NOTE) SARS-CoV-2 target nucleic acids are NOT DETECTED.  The SARS-CoV-2 RNA is generally detectable in upper respiratory specimens during the acute phase of infection. The lowest concentration of SARS-CoV-2 viral copies this assay can detect is 138 copies/mL. A negative result does not preclude SARS-Cov-2 infection and should not be used as the sole basis for treatment or other patient management decisions. A  negative result may occur with  improper specimen collection/handling, submission of specimen other than nasopharyngeal swab, presence of viral mutation(s) within the areas targeted by this assay, and inadequate number of viral copies(<138 copies/mL). A negative result must be combined with clinical observations, patient history, and epidemiological information. The expected result is Negative.  Fact Sheet for Patients:  EntrepreneurPulse.com.au  Fact Sheet for Healthcare Providers:  IncredibleEmployment.be  This test is no t yet approved or cleared by the [place] FDA and  has been authorized for detection and/or diagnosis of SARS-CoV-2 by FDA under an Emergency Use Authorization (EUA). This EUA will remain  in effect (meaning this test can be used) for the duration of the COVID-19 declaration under Section 564(b)(1) of the Act, 21 U.S.C.section 360bbb-3(b)(1), unless the authorization is terminated  or revoked sooner.       Influenza A by PCR NEGATIVE NEGATIVE Final   Influenza B by PCR NEGATIVE NEGATIVE Final    Comment: (NOTE) The Xpert Xpress SARS-CoV-2/FLU/RSV plus assay is intended as an aid in the diagnosis of influenza from Nasopharyngeal swab specimens and should not be used as a sole basis for treatment. Nasal washings and aspirates are unacceptable for Xpert Xpress SARS-CoV-2/FLU/RSV testing.  Fact Sheet for Patients: EntrepreneurPulse.com.au  Fact Sheet for Healthcare Providers: IncredibleEmployment.be  This test is not yet approved or cleared by  the [employer] and has been authorized for detection and/or diagnosis of SARS-CoV-2 by FDA under an Emergency Use Authorization (EUA). This EUA will remain in effect (meaning this test can be used) for the duration of the COVID-19 declaration under Section 564(b)(1) of the Act, 21 U.S.C. section 360bbb-3(b)(1), unless the authorization  is terminated or revoked.  Performed at [facility], [place] [address] [place], [place] [address]   Culture, blood (routine x 2)     Status: None (Preliminary result)   Collection Time: 10/05/21 10:55 PM   Specimen: BLOOD  Result Value Ref Range Status   Specimen Description   Final    BLOOD LEFT ANTECUBITAL Performed at [facility] [address] [place], [place] [address]    Special Requests   Final    BOTTLES DRAWN AEROBIC AND ANAEROBIC Blood Culture adequate volume Performed at [facility] [address], [place], [place] [address]    Culture   Final    NO GROWTH 4 DAYS Performed at [facility], [place] [address] [place], [place] [address]    Report Status PENDING  Incomplete  Culture, blood (routine x 2)     Status: None (Preliminary result)   Collection Time: 10/05/21 11:20 PM   Specimen: BLOOD  Result Value Ref Range Status   Specimen Description   Final    BLOOD SITE NOT SPECIFIED Performed at [facility] [address], [place], [place] [address]    Special Requests   Final    BOTTLES DRAWN AEROBIC AND ANAEROBIC Blood Culture adequate volume Performed at [facility] [address] [place], [place] [address]    Culture   Final    NO GROWTH 4 DAYS Performed at [facility], [place] [address] [place], [place] [address]    Report Status PENDING  Incomplete  MRSA Next Gen by PCR, Nasal     Status: None   Collection Time: 10/06/21  2:38 AM   Specimen: Nasal Mucosa; Nasal Swab  Result Value Ref Range Status   MRSA by PCR Next Gen NOT DETECTED NOT DETECTED Final    Comment: (NOTE) The GeneXpert MRSA Assay (FDA approved for NASAL specimens only), is one component of a comprehensive MRSA colonization surveillance program. It is not intended to diagnose MRSA infection nor to guide or monitor treatment for MRSA infections. Test performance is not FDA approved  in patients less than 73 years old. Performed at [facility], [place] [address], [place],  [address]      Time coordinating discharge:  I have spent 35 minutes face to face with the patient and on the ward discussing the patients care, assessment, plan and disposition with other care givers. >50% of the time was devoted counseling the patient about the risks and benefits of treatment/Discharge disposition and coordinating care.   SIGNED:   Damita Lack, MD  [facility] 10/11/2021, 12:20 PM   If 7PM-7AM, please contact night-coverage

## 2021-10-11 NOTE — Plan of Care (Signed)
  Problem: Clinical Measurements: Goal: Respiratory complications will improve Outcome: Adequate for Discharge   Problem: Clinical Measurements: Goal: Cardiovascular complication will be avoided Outcome: Adequate for Discharge   Problem: Activity: Goal: Risk for activity intolerance will decrease Outcome: Adequate for Discharge   Problem: Skin Integrity: Goal: Risk for impaired skin integrity will decrease Outcome: Adequate for Discharge   Problem: Activity: Goal: Ability to tolerate increased activity will improve Outcome: Adequate for Discharge

## 2021-10-11 NOTE — TOC Transition Note (Signed)
Transition of Care [facility]) - CM/SW Discharge Note   Patient Details  Name: Kayla Pace MRN: [id number] Date of Birth: 1921-09-16  Transition of [facility]) CM/SW Contact:  Dessa Phi, RN Phone Number: 10/11/2021, 1:04 PM   Clinical Narrative: d/c back to [facility]. [facility] called. Going to [id number],report tel# [phone]. No further CM needs.      Final next level of care: Long Term Nursing Home Barriers to Discharge: No Barriers Identified   Patient Goals and CMS Choice Patient states their goals for this hospitalization and ongoing recovery are:: [facility] [place] CMS Medicare.gov Compare Post Acute Care list provided to:: Patient Represenative (must comment) Choice offered to / list presented to : Adult Children  Discharge Placement                       Discharge Plan and Services   Discharge Planning Services: CM Consult Post Acute Care Choice: Nursing Home                               Social Determinants of Health (SDOH) Interventions     Readmission Risk Interventions     No data to display
